# Patient Record
Sex: Female | Born: 1980 | Hispanic: Yes | Marital: Single | Smoking: Never smoker
Health system: Southern US, Community
[De-identification: ages and names within clinical notes are randomized; demographics above are authoritative.]

## PROBLEM LIST (undated history)

## (undated) DIAGNOSIS — D649 Anemia, unspecified: Secondary | ICD-10-CM

## (undated) DIAGNOSIS — N8502 Endometrial intraepithelial neoplasia [EIN]: Secondary | ICD-10-CM

## (undated) DIAGNOSIS — N939 Abnormal uterine and vaginal bleeding, unspecified: Secondary | ICD-10-CM

## (undated) DIAGNOSIS — E669 Obesity, unspecified: Secondary | ICD-10-CM

## (undated) DIAGNOSIS — C55 Malignant neoplasm of uterus, part unspecified: Secondary | ICD-10-CM

## (undated) HISTORY — DX: Anemia, unspecified: D64.9

## (undated) HISTORY — DX: Malignant neoplasm of uterus, part unspecified: C55

## (undated) HISTORY — DX: Obesity, unspecified: E66.9

## (undated) HISTORY — DX: Endometrial intraepithelial neoplasia (EIN): N85.02

## (undated) HISTORY — DX: Abnormal uterine and vaginal bleeding, unspecified: N93.9

## (undated) HISTORY — PX: DILATION AND CURETTAGE OF UTERUS: SHX78

---

## 2019-08-04 ENCOUNTER — Other Ambulatory Visit: Payer: Self-pay

## 2019-08-04 ENCOUNTER — Encounter (HOSPITAL_BASED_OUTPATIENT_CLINIC_OR_DEPARTMENT_OTHER): Payer: Self-pay | Admitting: Emergency Medicine

## 2019-08-04 ENCOUNTER — Emergency Department (HOSPITAL_BASED_OUTPATIENT_CLINIC_OR_DEPARTMENT_OTHER)
Admission: EM | Admit: 2019-08-04 | Discharge: 2019-08-04 | Disposition: A | Discharge status: Home / Self Care | Payer: Worker's Compensation | Attending: Emergency Medicine | Admitting: Emergency Medicine

## 2019-08-04 DIAGNOSIS — Y9289 Other specified places as the place of occurrence of the external cause: Secondary | ICD-10-CM | POA: Diagnosis not present

## 2019-08-04 DIAGNOSIS — Y990 Civilian activity done for income or pay: Secondary | ICD-10-CM | POA: Diagnosis not present

## 2019-08-04 DIAGNOSIS — F411 Generalized anxiety disorder: Secondary | ICD-10-CM

## 2019-08-04 DIAGNOSIS — F410 Panic disorder [episodic paroxysmal anxiety] without agoraphobia: Secondary | ICD-10-CM | POA: Diagnosis present

## 2019-08-04 DIAGNOSIS — Y9389 Activity, other specified: Secondary | ICD-10-CM | POA: Insufficient documentation

## 2019-08-04 MED ORDER — ONDANSETRON HCL 4 MG PO TABS
4.0000 mg | ORAL_TABLET | Freq: Once | ORAL | Status: DC
  Filled 2019-08-04: qty 1

## 2019-08-04 MED ORDER — ONDANSETRON 4 MG PO TBDP
4.0000 mg | ORAL_TABLET | Freq: Once | ORAL | Status: AC
  Administered 2019-08-04: 4 mg via ORAL
  Filled 2019-08-04: qty 1

## 2019-08-04 MED ORDER — ACETAMINOPHEN 325 MG PO TABS
650.0000 mg | ORAL_TABLET | Freq: Once | ORAL | Status: AC
  Administered 2019-08-04: 650 mg via ORAL
  Filled 2019-08-04: qty 2
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
## 2019-08-04 NOTE — ED Notes (Signed)
Pt ambulatory with steady gait, calm demeanor at bedside when being triaged.
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
## 2019-09-27 ENCOUNTER — Telehealth: Payer: Self-pay | Admitting: *Deleted

## 2019-09-27 NOTE — Telephone Encounter (Signed)
Called and left the patient a message to call the office back. Patient needs to be scheduled for a new patient appt  °

## 2019-10-04 ENCOUNTER — Encounter: Payer: Self-pay | Admitting: Gynecologic Oncology

## 2019-10-04 NOTE — Progress Notes (Signed)
GYNECOLOGIC ONCOLOGY NEW PATIENT CONSULTATION   Patient Name: Shirley Pearson  Patient Age: 39 y.o. Date of Service: 10/08/19 Referring Provider: Dr. 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  We reviewed the diagnosis of complex atypical hyperplasia (CAH) and the treatment options, including medical management (Mirena IUD or progesterone PO) or hysterectomy.    Given her desire to maintain fertility, she has elected medical management with the goal of disease clearance and prevention of invasive adenocarcinoma.     We discussed that endometrial cancer is detected in about 40% of final uterine pathology specimens from patients with CAH.  Thus, especially in the setting of her markedly thickened endometrial lining, I recommend that we proceed to the operating room for a D&C with Mirena IUD insertion.  We reviewed the role of progesterone therapy and the effect on preneoplastic lesions, believed to include induction of apoptosis in addition to tissue sloughing during withdrawal bleeding.  Activation of the progesterone receptors is believed to lead stromal decidualization and thinning of the lining.  We reviewed the 3 most studied options, to include levonorgesterol IUD (17mcg/d), oral medorxyprogesterone acetate 10mg  daily or cyclically [id number] days per month, or oral megesterol acetate 40-200mg  per day.    She understands that all options have few side effects, most common being infrequent edema, GI disturbances, and thromboembolic events), but that local progesterone through IUD may have a stronger effect on the endometrium with less systemic side effects.  Furthermore, studies demonstrate that 50-70% of women will have regression of their hyperplasia with  progesterone use.    With IUD use, we anticipate that the average duration to regression is 4.5 months, with all cases anticipated to have regression by 9 months.     She has elected to proceed with Mirena IUD insertion for treatment of her cancer.  We discussed that if her final pathology at the time of D&C comes back showing cancer, then we will have further discussion about fertility sparing versus proceeding with surgery.  If she elects to pursue fertility sparing, she understands that plan will be the same in terms of frequent endometrial sampling and that we will also obtain pelvic imaging to rule out Myometrial invasion.  The patient very much wants to maintain her fertility.  She has not been able to achieve pregnancy despite never really using birth control with her partner over a number of years.  We discussed that I think she has anovulatory bleeding and my hope is that with even a small amount of weight loss, this will impact both her hyperplasia but also her ovulation.  She understands that if she has multiple negative biopsies, we can consider removing the IUD for her to attempt pregnancy.  If this were to happen, I would want her to see a gynecologist for possible ovarian stimulation and more active management of pregnancy attempt.  For monitoring, she understands that there is no recommend standard of care.  We will base her surveillance on GOG 224 protocol.  This will include EMB at 3 months following initiation of treatment.  EMBs will be performed at 3 to 6 month intervals, until a minimum of 3 negative biopsy results are obtained, after which sampling frequeny may then be yearly or until new abnormal uterine bleeding develops.  If persistence of CAH is noted by  9 months of treatment, then we will discuss additional agents or readiness for surgery.    We also reviewed the importance of weight loss in overall health as well as reduction in cancer risk.  I have offered nutrition/exercise  counseling today.  The patient voices being motivated to continue with weight loss.  A copy of this note was sent to the patient's referring provider.  50 minutes of total time was spent for this patient encounter, including preparation, face-to-face counseling with the patient and coordination of care, and documentation of the encounter.    Jeral Pinch, MD  Division of Gynecologic Oncology  Department of Obstetrics and Gynecology  [facility]  ___________________________________________  Chief Complaint: Chief Complaint  Patient presents with  . Complex endometrial hyperplasia    Follow up    History of Present Illness:  Shirley Pearson is a 39 y.o. y.o. female who is seen in consultation at the request ofDr. McComb for an evaluation of CAH.  The patient has a long history of abnormal uterine bleeding and recently underwent endometrial biopsy.  Her pathology was initially read as complex hyperplasia but secondary review found at least focal atypia.  She is currently on oral birth control pills to help with abnormal uterine bleeding.  This is thought to be due secondary to anovulatory bleeding with significant anemia and a hemoglobin as low as 7.  Her hemoglobin had most recently improved to 8.4 after being started on oral contraceptive pills and iron supplementation.  Ultrasound showed a uterus measuring 15.8 x 12.6 x 14.2 cm with an endometrial lining measuring 9.1 cm.  Today, the patient reports having irregular menses for a significant amount of time, approximately 15 years.  She describes that her bleeding during that time was almost daily.  On her heaviest days, she would saturate 10-12 depends.  On her lighter days, she would not fully saturate 1.  She has low back pain with more significant bleeding and occasional pelvic pain and cramping.  Some years ago, she thinks approximately 10, she underwent 1 or 2 dilation and curettage is at the [facility] for heavy bleeding.  She remembers having increased bleeding afterwards.  Until she saw Dr. Renold Don, she denies any sort of hormonal modulation to help regulate her bleeding.  Patient was feeling very tired with no energy to do daily activities.  Since starting her Provera 10 mg daily, she has had significant decrease in her bleeding and notes improved energy.  She is also taking daily iron now.  She endorses having a decent appetite although for the last year endorses occasional nausea when she eats.  She notes some intermittent constipation especially since starting iron.  She is not using anything for bowel function regulation.  She denies any urinary symptoms.  PAST MEDICAL HISTORY:  Past Medical History:  Diagnosis Date  . Abnormal uterine bleeding   . Anemia   . Complex atypical endometrial hyperplasia   . Obesity (BMI 30-39.9)      PAST SURGICAL HISTORY:  Past Surgical History:  Procedure Laterality Date  . DILATION AND CURETTAGE OF UTERUS      OB/GYN HISTORY:  OB History  Gravida Para Term Preterm AB Living  0 0 0 0 0 0  SAB TAB Ectopic Multiple Live Births  0 0 0 0 0    No LMP recorded.  Last pap: 08/2019- atypical glandular cells History of abnormal pap smears: yes, see above; no history prior to recent pap  MEDICATIONS:  Outpatient Encounter Medications as of 10/08/2019  Medication Sig  . Ferrous Sulfate (IRON PO) Take by mouth.  . medroxyPROGESTERone (PROVERA) 10 MG tablet Take 10 mg by mouth daily.   No facility-administered encounter medications on file as of 10/08/2019.    ALLERGIES:  No Known Allergies   FAMILY HISTORY:  Family History  Problem Relation Age of Onset  . Diabetes Mother   . Colon cancer Neg Hx   . Breast cancer Neg Hx   . Ovarian cancer Neg Hx   . Uterine cancer Neg Hx      SOCIAL HISTORY:    Social Connections:   . Frequency of Communication with Friends and Family:   . Frequency of Social Gatherings with Friends and  Family:   . Attends Religious Services:   . Active Member of Clubs or Organizations:   . Attends Archivist Meetings:   Marland Kitchen Marital Status:     REVIEW OF SYSTEMS:  Pertinent positives as per HPI. Denies fevers, chills, fatigue, unexplained weight changes. Denies hearing loss, neck lumps or masses, mouth sores, ringing in ears or voice changes. Denies cough or wheezing.  Denies shortness of breath. Denies chest pain or palpitations. Denies leg swelling. Denies abdominal distention, pain, blood in stools, diarrhea, vomiting, or early satiety. Denies pain with intercourse, dysuria, frequency, hematuria or incontinence. Denies hot flashes or vaginal discharge.   Denies joint pain or muscle pain/cramps. Denies itching, rash, or wounds. Denies dizziness, headaches, numbness or seizures. Denies swollen lymph nodes or glands, denies easy bruising or bleeding. Denies anxiety, depression, confusion, or decreased concentration.  Physical Exam:  Vital Signs for this encounter:  Blood pressure 138/87, pulse 80, temperature 98.6 F (37 C), temperature source Temporal, resp. rate 17, height 5' (1.524 m), weight 160 lb 3.2 oz (72.7 kg), SpO2 100 %. Body mass index is 31.29 kg/m. General: Alert, oriented, no acute distress.  HEENT: Normocephalic, atraumatic. Sclera anicteric.  Chest: Clear to auscultation bilaterally. No wheezes, rhonchi, or rales. Cardiovascular: Regular rate and rhythm, no murmurs, rubs, or gallops.  Abdomen: Obese. Normoactive bowel sounds. Soft, nondistended, nontender to palpation. No hepatosplenomegaly appreciated.  Firmness noted in lower abdomen.  No palpable fluid wave.  Extremities: Grossly normal range of motion. Warm, well perfused. No edema bilaterally.  Skin: No rashes or lesions.  Lymphatics: No cervical, supraclavicular, or inguinal adenopathy.  GU:  Normal external female genitalia. No lesions. No discharge or bleeding.             Bladder/urethra:  No  lesions or masses, well supported bladder             Vagina: Well rugated, minimal blood in the vault.             Cervix: Normal appearing, no lesions.  Cervix visibly dilated approximately 1 cm with what appears to be tissue in the os.  With the patient's permission the cervix was cleansed with Betadine x3 and ring forceps were used to pull out several 1-2 cm pieces of tissue.  There was increased bleeding noted from the cervix after this was done.  Accommodation of pressure and Monsel's was used to achieve hemostasis.  Overall the patient tolerated the procedure well.               Uterus: Enlarged spanning within several centimeters of the patient's umbilicus and quite bulbous with a prominent lower uterine segment, no parametrial involvement or nodularity.  Adnexa: No masses appareciated.  LABORATORY AND RADIOLOGIC DATA:  Outside medical records were reviewed to synthesize the above history, along with the history and physical obtained during the visit.   No results found for: WBC, HGB, HCT, PLT, GLUCOSE, CHOL, TRIG, HDL, LDLDIRECT, LDLCALC, ALT, AST, NA, K, CL, CREATININE, BUN, CO2, TSH, PSA, INR, GLUF, HGBA1C, MICROALBUR  Endometrial biopsy on 4/29: Complex hyperplasia with focal atypia, likely involving a polyp.

## 2019-10-04 NOTE — H&P (View-Only) (Signed)
GYNECOLOGIC ONCOLOGY NEW PATIENT CONSULTATION   Patient Name: Shirley Pearson  Patient Age: 39 y.o. Date of Service: 10/08/19 Referring Provider: Dr. 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  We reviewed the diagnosis of complex atypical hyperplasia (CAH) and the treatment options, including medical management (Mirena IUD or progesterone PO) or hysterectomy.    Given her desire to maintain fertility, she has elected medical management with the goal of disease clearance and prevention of invasive adenocarcinoma.     We discussed that endometrial cancer is detected in about 40% of final uterine pathology specimens from patients with CAH.  Thus, especially in the setting of her markedly thickened endometrial lining, I recommend that we proceed to the operating room for a D&C with Mirena IUD insertion.  We reviewed the role of progesterone therapy and the effect on preneoplastic lesions, believed to include induction of apoptosis in addition to tissue sloughing during withdrawal bleeding.  Activation of the progesterone receptors is believed to lead stromal decidualization and thinning of the lining.  We reviewed the 3 most studied options, to include levonorgesterol IUD (53mcg/d), oral medorxyprogesterone acetate 10mg  daily or cyclically [id number] days per month, or oral megesterol acetate 40-200mg  per day.    She understands that all options have few side effects, most common being infrequent edema, GI disturbances, and thromboembolic events), but that local progesterone through IUD may have a stronger effect on the endometrium with less systemic side effects.  Furthermore, studies demonstrate that 50-70% of women will have regression of their hyperplasia with  progesterone use.    With IUD use, we anticipate that the average duration to regression is 4.5 months, with all cases anticipated to have regression by 9 months.     She has elected to proceed with Mirena IUD insertion for treatment of her cancer.  We discussed that if her final pathology at the time of D&C comes back showing cancer, then we will have further discussion about fertility sparing versus proceeding with surgery.  If she elects to pursue fertility sparing, she understands that plan will be the same in terms of frequent endometrial sampling and that we will also obtain pelvic imaging to rule out Myometrial invasion.  The patient very much wants to maintain her fertility.  She has not been able to achieve pregnancy despite never really using birth control with her partner over a number of years.  We discussed that I think she has anovulatory bleeding and my hope is that with even a small amount of weight loss, this will impact both her hyperplasia but also her ovulation.  She understands that if she has multiple negative biopsies, we can consider removing the IUD for her to attempt pregnancy.  If this were to happen, I would want her to see a gynecologist for possible ovarian stimulation and more active management of pregnancy attempt.  For monitoring, she understands that there is no recommend standard of care.  We will base her surveillance on GOG 224 protocol.  This will include EMB at 3 months following initiation of treatment.  EMBs will be performed at 3 to 6 month intervals, until a minimum of 3 negative biopsy results are obtained, after which sampling frequeny may then be yearly or until new abnormal uterine bleeding develops.  If persistence of CAH is noted by  9 months of treatment, then we will discuss additional agents or readiness for surgery.    We also reviewed the importance of weight loss in overall health as well as reduction in cancer risk.  I have offered nutrition/exercise  counseling today.  The patient voices being motivated to continue with weight loss.  A copy of this note was sent to the patient's referring provider.  50 minutes of total time was spent for this patient encounter, including preparation, face-to-face counseling with the patient and coordination of care, and documentation of the encounter.    Jeral Pinch, MD  Division of Gynecologic Oncology  Department of Obstetrics and Gynecology  [facility]  ___________________________________________  Chief Complaint: Chief Complaint  Patient presents with  . Complex endometrial hyperplasia    Follow up    History of Present Illness:  Shirley Pearson is a 39 y.o. y.o. female who is seen in consultation at the request ofDr. McComb for an evaluation of CAH.  The patient has a long history of abnormal uterine bleeding and recently underwent endometrial biopsy.  Her pathology was initially read as complex hyperplasia but secondary review found at least focal atypia.  She is currently on oral birth control pills to help with abnormal uterine bleeding.  This is thought to be due secondary to anovulatory bleeding with significant anemia and a hemoglobin as low as 7.  Her hemoglobin had most recently improved to 8.4 after being started on oral contraceptive pills and iron supplementation.  Ultrasound showed a uterus measuring 15.8 x 12.6 x 14.2 cm with an endometrial lining measuring 9.1 cm.  Today, the patient reports having irregular menses for a significant amount of time, approximately 15 years.  She describes that her bleeding during that time was almost daily.  On her heaviest days, she would saturate 10-12 depends.  On her lighter days, she would not fully saturate 1.  She has low back pain with more significant bleeding and occasional pelvic pain and cramping.  Some years ago, she thinks approximately 10, she underwent 1 or 2 dilation and curettage is at the [facility] for heavy bleeding.  She remembers having increased bleeding afterwards.  Until she saw Dr. Renold Don, she denies any sort of hormonal modulation to help regulate her bleeding.  Patient was feeling very tired with no energy to do daily activities.  Since starting her Provera 10 mg daily, she has had significant decrease in her bleeding and notes improved energy.  She is also taking daily iron now.  She endorses having a decent appetite although for the last year endorses occasional nausea when she eats.  She notes some intermittent constipation especially since starting iron.  She is not using anything for bowel function regulation.  She denies any urinary symptoms.  PAST MEDICAL HISTORY:  Past Medical History:  Diagnosis Date  . Abnormal uterine bleeding   . Anemia   . Complex atypical endometrial hyperplasia   . Obesity (BMI 30-39.9)      PAST SURGICAL HISTORY:  Past Surgical History:  Procedure Laterality Date  . DILATION AND CURETTAGE OF UTERUS      OB/GYN HISTORY:  OB History  Gravida Para Term Preterm AB Living  0 0 0 0 0 0  SAB TAB Ectopic Multiple Live Births  0 0 0 0 0    No LMP recorded.  Last pap: 08/2019- atypical glandular cells History of abnormal pap smears: yes, see above; no history prior to recent pap  MEDICATIONS:  Outpatient Encounter Medications as of 10/08/2019  Medication Sig  . Ferrous Sulfate (IRON PO) Take by mouth.  . medroxyPROGESTERone (PROVERA) 10 MG tablet Take 10 mg by mouth daily.   No facility-administered encounter medications on file as of 10/08/2019.    ALLERGIES:  No Known Allergies   FAMILY HISTORY:  Family History  Problem Relation Age of Onset  . Diabetes Mother   . Colon cancer Neg Hx   . Breast cancer Neg Hx   . Ovarian cancer Neg Hx   . Uterine cancer Neg Hx      SOCIAL HISTORY:    Social Connections:   . Frequency of Communication with Friends and Family:   . Frequency of Social Gatherings with Friends and  Family:   . Attends Religious Services:   . Active Member of Clubs or Organizations:   . Attends Archivist Meetings:   Marland Kitchen Marital Status:     REVIEW OF SYSTEMS:  Pertinent positives as per HPI. Denies fevers, chills, fatigue, unexplained weight changes. Denies hearing loss, neck lumps or masses, mouth sores, ringing in ears or voice changes. Denies cough or wheezing.  Denies shortness of breath. Denies chest pain or palpitations. Denies leg swelling. Denies abdominal distention, pain, blood in stools, diarrhea, vomiting, or early satiety. Denies pain with intercourse, dysuria, frequency, hematuria or incontinence. Denies hot flashes or vaginal discharge.   Denies joint pain or muscle pain/cramps. Denies itching, rash, or wounds. Denies dizziness, headaches, numbness or seizures. Denies swollen lymph nodes or glands, denies easy bruising or bleeding. Denies anxiety, depression, confusion, or decreased concentration.  Physical Exam:  Vital Signs for this encounter:  Blood pressure 138/87, pulse 80, temperature 98.6 F (37 C), temperature source Temporal, resp. rate 17, height 5' (1.524 m), weight 160 lb 3.2 oz (72.7 kg), SpO2 100 %. Body mass index is 31.29 kg/m. General: Alert, oriented, no acute distress.  HEENT: Normocephalic, atraumatic. Sclera anicteric.  Chest: Clear to auscultation bilaterally. No wheezes, rhonchi, or rales. Cardiovascular: Regular rate and rhythm, no murmurs, rubs, or gallops.  Abdomen: Obese. Normoactive bowel sounds. Soft, nondistended, nontender to palpation. No hepatosplenomegaly appreciated.  Firmness noted in lower abdomen.  No palpable fluid wave.  Extremities: Grossly normal range of motion. Warm, well perfused. No edema bilaterally.  Skin: No rashes or lesions.  Lymphatics: No cervical, supraclavicular, or inguinal adenopathy.  GU:  Normal external female genitalia. No lesions. No discharge or bleeding.             Bladder/urethra:  No  lesions or masses, well supported bladder             Vagina: Well rugated, minimal blood in the vault.             Cervix: Normal appearing, no lesions.  Cervix visibly dilated approximately 1 cm with what appears to be tissue in the os.  With the patient's permission the cervix was cleansed with Betadine x3 and ring forceps were used to pull out several 1-2 cm pieces of tissue.  There was increased bleeding noted from the cervix after this was done.  Accommodation of pressure and Monsel's was used to achieve hemostasis.  Overall the patient tolerated the procedure well.               Uterus: Enlarged spanning within several centimeters of the patient's umbilicus and quite bulbous with a prominent lower uterine segment, no parametrial involvement or nodularity.  Adnexa: No masses appareciated.  LABORATORY AND RADIOLOGIC DATA:  Outside medical records were reviewed to synthesize the above history, along with the history and physical obtained during the visit.   No results found for: WBC, HGB, HCT, PLT, GLUCOSE, CHOL, TRIG, HDL, LDLDIRECT, LDLCALC, ALT, AST, NA, K, CL, CREATININE, BUN, CO2, TSH, PSA, INR, GLUF, HGBA1C, MICROALBUR  Endometrial biopsy on 4/29: Complex hyperplasia with focal atypia, likely involving a polyp.

## 2019-10-08 ENCOUNTER — Other Ambulatory Visit: Payer: Self-pay | Admitting: Gynecologic Oncology

## 2019-10-08 ENCOUNTER — Other Ambulatory Visit: Payer: Self-pay

## 2019-10-08 ENCOUNTER — Inpatient Hospital Stay: Payer: Self-pay | Attending: Gynecologic Oncology | Admitting: Gynecologic Oncology

## 2019-10-08 ENCOUNTER — Encounter: Payer: Self-pay | Admitting: Gynecologic Oncology

## 2019-10-08 VITALS — BP 138/87 | HR 80 | Temp 98.6°F | Resp 17 | Ht 60.0 in | Wt 160.2 lb

## 2019-10-08 DIAGNOSIS — N8501 Benign endometrial hyperplasia: Secondary | ICD-10-CM

## 2019-10-08 DIAGNOSIS — K5900 Constipation, unspecified: Secondary | ICD-10-CM | POA: Insufficient documentation

## 2019-10-08 DIAGNOSIS — D500 Iron deficiency anemia secondary to blood loss (chronic): Secondary | ICD-10-CM

## 2019-10-08 MED ORDER — SENNA 8.6 MG PO TABS: 1.0000 | ORAL_TABLET | Freq: Every day | ORAL | 3 refills | Status: DC | PRN

## 2019-10-08 NOTE — Addendum Note (Signed)
Addended by: Joylene John D on: 10/08/2019 02:47 PM   Modules accepted: Orders

## 2019-10-08 NOTE — Progress Notes (Signed)
[place] intraop ordered for [facility] on June 7

## 2019-10-08 NOTE — Patient Instructions (Signed)
Preparing for your Surgery  Plan for surgery on October 22, 2019 with Dr. Jeral Pinch at [facility].  You will be scheduled for a dilation and curettage with Mirena intrauterine device placement, possible hysteroscopy, possible ultrasound.   Pre-operative Testing -You will receive a phone call from presurgical testing at [facility] to arrange for a pre-operative appointment over the phone, lab appointment, and COVID test. The COVID test normally happens 3 days prior to the surgery and they ask that you self quarantine after the test up until surgery to decrease chance of exposure.  -Bring your insurance card, copy of an advanced directive if applicable, medication list  -At that visit, you will be asked to sign a consent for a possible blood transfusion in case a transfusion becomes necessary during surgery.  The need for a blood transfusion is rare but having consent is a necessary part of your care.     -You should not be taking blood thinners or aspirin at least ten days prior to surgery unless instructed by your surgeon.  -Do not take supplements such as fish oil (omega 3), red yeast rice, turmeric before your surgery.   Day Before Surgery at [place] will be advised you can have clear liquids after midnight and up until 3 hours before your surgery.    Your role in recovery Your role is to become active as soon as directed by your doctor, while still giving yourself time to heal.  Rest when you feel tired. You will be asked to do the following in order to speed your recovery:  - Cough and breathe deeply. This helps to clear and expand your lungs and can prevent pneumonia after surgery.  - [place]. Do mild physical activity. Walking or moving your legs help your circulation and body functions return to normal. Do not try to get up or walk alone the first time after surgery.   -If you develop swelling on one leg or the other,  pain in the back of your leg, redness/warmth in one of your legs, please call the office or go to the Emergency Room to have a doppler to rule out a blood clot. For shortness of breath, chest pain-seek care in the Emergency Room as soon as possible. - Actively manage your pain. Managing your pain lets you move in comfort. We will ask you to rate your pain on a scale of zero to 10. It is your responsibility to tell your doctor or nurse where and how much you hurt so your pain can be treated.  Special Considerations -FMLA forms can be faxed to [phone] and please allow 5-7 business days for completion.  Bowel Regimen -You have been prescribed Sennakot-S to take nightly to prevent constipation.  It is important to prevent constipation and drink adequate amounts of liquids. You can stop taking this medication when you are not taking pain medication and you are back on your normal bowel routine.   Blood Transfusion Information (For the consent to be signed before surgery)  We will be checking your blood type before surgery so in case of emergencies, we will know what type of blood you would need.                                            WHAT IS A BLOOD  TRANSFUSION?  A transfusion is the replacement of blood or some of its parts. Blood is made up of multiple cells which provide different functions.  Red blood cells carry oxygen and are used for blood loss replacement.  White blood cells fight against infection.  Platelets control bleeding.  Plasma helps clot blood.  Other blood products are available for specialized needs, such as hemophilia or other clotting disorders. BEFORE THE TRANSFUSION  Who gives blood for transfusions?   You may be able to donate blood to be used at a later date on yourself (autologous donation).  Relatives can be asked to donate blood. This is generally not any safer than if you have received blood from a stranger. The same precautions are taken to ensure  safety when a relative's blood is donated.  Healthy volunteers who are fully evaluated to make sure their blood is safe. This is blood bank blood. Transfusion therapy is the safest it has ever been in the practice of medicine. Before blood is taken from a donor, a complete history is taken to make sure that person has no history of diseases nor engages in risky social behavior (examples are intravenous drug use or sexual activity with multiple partners). The donor's travel history is screened to minimize risk of transmitting infections, such as malaria. The donated blood is tested for signs of infectious diseases, such as HIV and hepatitis. The blood is then tested to be sure it is compatible with you in order to minimize the chance of a transfusion reaction. If you or a relative donates blood, this is often done in anticipation of surgery and is not appropriate for emergency situations. It takes many days to process the donated blood. RISKS AND COMPLICATIONS Although transfusion therapy is very safe and saves many lives, the main dangers of transfusion include:   Getting an infectious disease.  Developing a transfusion reaction. This is an allergic reaction to something in the blood you were given. Every precaution is taken to prevent this. The decision to have a blood transfusion has been considered carefully by your caregiver before blood is given. Blood is not given unless the benefits outweigh the risks.  AFTER SURGERY INSTRUCTIONS  Return to work: 2-4 days if applicable  Activity: 1. Be up and out of the bed during the day.  Take a nap if needed.  You may walk up steps but be careful and use the hand rail.  Stair climbing will tire you more than you think, you may need to stop part way and rest.   3. No driving for 24 hours after the procedure.  Do not drive until your reaction time has returned.   4. You can shower as soon as the next day after surgery. Shower daily.  Use soap and water  on your incision and pat dry; don't rub.  No tub baths or submerging your body in water until cleared by your surgeon.   5. No sexual activity and nothing in the vagina for 2 weeks.   6. You may experience vaginal spotting after surgery or around the 6-8 week mark from surgery when the stitches at the top of the vagina begin to dissolve.  The spotting is normal but if you experience heavy bleeding, call our office.  9. Take Tylenol or ibuprofen first for pain.  Monitor your Tylenol intake to a max of 4,000 mg in a 24 hour period.  Diet: 1. Low sodium Heart Healthy Diet is recommended.  2. It is safe to  use a laxative, such as Miralax or Colace, if you have difficulty moving your bowels. You can take Sennakot at bedtime every evening to keep bowel movements regular and to prevent constipation.    Wound Care: 1. Keep clean and dry.  Shower daily.  Reasons to call the Doctor:  Fever - Oral temperature greater than 100.4 degrees Fahrenheit  Foul-smelling vaginal discharge  Difficulty urinating  Nausea and vomiting  Increased pain at the site of the incision that is unrelieved with pain medicine.  Difficulty breathing with or without chest pain  New calf pain especially if only on one side  Sudden, continuing increased vaginal bleeding with or without clots.   Contacts: For questions or concerns you should contact:  Dr. Jeral Pinch at [phone]  Joylene John, NP at [phone]  After Hours: call [phone] and have the GYN Oncologist paged/contacted   Intrauterine Device Insertion An intrauterine device (IUD) is a medical device that gets inserted into the uterus to prevent pregnancy. It is a small, T-shaped device that has one or two nylon strings hanging down from it. The strings hang out of the lower part of the uterus (cervix) to allow for future IUD removal. There are two types of IUDs available:  Copper IUD. This type of IUD has copper wire wrapped around  it. Copper makes the uterus and fallopian tubes produce a fluid that kills sperm. A copper IUD may last up to 10 years.  Hormone IUD. This type of IUD is made of plastic and contains the hormone progestin (synthetic progesterone). The hormone thickens mucus in the cervix and prevents sperm from entering the uterus. It also thins the uterine lining to prevent implantation of a fertilized egg. The hormone can weaken or kill the sperm that get into the uterus. A hormone IUD may last 3-5 years. Tell a health care provider about:  Any allergies you have.  All medicines you are taking, including vitamins, herbs, eye drops, creams, and over-the-counter medicines.  Any problems you or family members have had with anesthetic medicines.  Any blood disorders you have.  Any surgeries you have had.  Any medical conditions you have, including any STIs (sexually transmitted infections) you may have.  Whether you are pregnant or may be pregnant. What are the risks? Generally, this is a safe procedure. However, problems may occur, including:  Infection.  Bleeding.  Allergic reactions to medicines.  Accidental puncture (perforation) of the uterus, or damage to other structures or organs.  Accidental placement of the IUD either in the muscle layer of the uterus (myometrium) or outside the uterus.  The IUD falling out of the uterus (expulsion). This is more common among women who have recently had a child.  Pregnancy that happens in the fallopian tube (ectopic pregnancy).  Infection of the uterus and fallopian tubes (pelvic inflammatory disease). What happens before the procedure?  Schedule the IUD insertion for when you will have your menstrual period or right after, to make sure you are not pregnant. Placement of the IUD is better tolerated shortly after a menstrual cycle.  Follow instructions from your health care provider about eating or drinking restrictions.  Ask your health care provider  about changing or stopping your regular medicines. This is especially important if you are taking diabetes medicines or blood thinners.  You may get a pain reliever to take before the procedure.  You may have tests for:  Pregnancy. A pregnancy test involves having a urine sample taken.  STIs. Placing an IUD  in someone who has an STI can make the infection worse.  Cervical cancer. You may have a Pap test to check for this type of cancer. This means collecting cells from your cervix to be examined under a microscope.  You may have a physical exam to determine the size and position of your uterus. The procedure may vary among health care providers and hospitals. What happens during the procedure?  A tool (speculum) will be placed in your vagina and widened so that your health care provider can see your cervix.  Medicine may be applied to your cervix to help lower your risk of infection (antiseptic medicine).  You may be given an anesthetic medicine to numb each side of your cervix (intracervical block or paracervical block). This medicine is usually given by an injection into the cervix.  A tool (uterine sound) will be inserted into your uterus to determine the length of your uterus and the direction that your uterus may be tilted.  A slim instrument or tube (IUD inserter) that holds the IUD will be inserted into your vagina, through your cervical canal, and into your uterus.  The IUD will be placed in the uterus, and the IUD inserter will be removed.  The strings that are attached to the IUD will be trimmed so that they lie just below the cervix. The procedure may vary among health care providers and hospitals. What happens after the procedure?  You may have bleeding after the procedure. This is normal. It varies from light bleeding (spotting) for a few days to menstrual-like bleeding.  You may have cramping and pain.  You may feel dizzy or light-headed.  You may have lower back  pain. Summary  An intrauterine device (IUD) is a small, T-shaped device that has one or two nylon strings hanging down from it.  Two types of IUDs are available. You may have a copper IUD or a hormone IUD.  Schedule the IUD insertion for when you will have your menstrual period or right after, to make sure you are not pregnant. Placement of the IUD is better tolerated shortly after a menstrual cycle.  You may have bleeding after the procedure. This is normal. It varies from light spotting for a few days to menstrual-like bleeding. This information is not intended to replace advice given to you by your health care provider. Make sure you discuss any questions you have with your health care provider. Document Released: 12/30/2010 Document Revised: 03/24/2016 Document Reviewed: 03/24/2016 Elsevier Interactive Patient Education  2017 [employer].  Levonorgestrel intrauterine device (IUD) What is this medicine? LEVONORGESTREL IUD (LEE voe nor jes trel) is a contraceptive (birth control) device. The device is placed inside the uterus by a healthcare professional. It is used to treat hyperplasia. This device can also be used to treat heavy bleeding that occurs during your period. This medicine may be used for other purposes; ask your health care provider or pharmacist if you have questions. COMMON BRAND NAME(S): Minette Headland What should I tell my health care provider before I take this medicine? They need to know if you have any of these conditions: -abnormal Pap smear -cancer of the breast, uterus, or cervix -diabetes -endometritis -genital or pelvic infection now or in the past -have more than one sexual partner or your partner has more than one partner -heart disease -history of an ectopic or tubal pregnancy -immune system problems -IUD in place -liver disease or tumor -problems with blood clots or take blood-thinners -seizures -  use intravenous drugs -uterus of  unusual shape -vaginal bleeding that has not been explained -an unusual or allergic reaction to levonorgestrel, other hormones, silicone, or polyethylene, medicines, foods, dyes, or preservatives -pregnant or trying to get pregnant -breast-feeding How should I use this medicine? This device is placed inside the uterus by a health care professional. Talk to your pediatrician regarding the use of this medicine in children. Special care may be needed. Overdosage: If you think you have taken too much of this medicine contact a poison control center or emergency room at once. NOTE: This medicine is only for you. Do not share this medicine with others. What if I miss a dose? This does not apply. Depending on the brand of device you have inserted, the device will need to be replaced every 3 to 5 years if you wish to continue using this type of birth control. What may interact with this medicine? Do not take this medicine with any of the following medications: -amprenavir -bosentan -fosamprenavir This medicine may also interact with the following medications: -aprepitant -armodafinil -barbiturate medicines for inducing sleep or treating seizures -bexarotene -boceprevir -griseofulvin -medicines to treat seizures like carbamazepine, ethotoin, felbamate, oxcarbazepine, phenytoin, topiramate -modafinil -pioglitazone -rifabutin -rifampin -rifapentine -some medicines to treat HIV infection like atazanavir, efavirenz, indinavir, lopinavir, nelfinavir, tipranavir, ritonavir -St. John's wort -warfarin This list may not describe all possible interactions. Give your health care provider a list of all the medicines, herbs, non-prescription drugs, or dietary supplements you use. Also tell them if you smoke, drink alcohol, or use illegal drugs. Some items may interact with your medicine. What should I watch for while using this medicine? Visit your doctor or health care professional for regular check  ups. See your doctor if you or your partner has sexual contact with others, becomes HIV positive, or gets a sexual transmitted disease. This product does not protect you against HIV infection (AIDS) or other sexually transmitted diseases. You can check the placement of the IUD yourself by reaching up to the top of your vagina with clean fingers to feel the threads. Do not pull on the threads. It is a good habit to check placement after each menstrual period. Call your doctor right away if you feel more of the IUD than just the threads or if you cannot feel the threads at all. The IUD may come out by itself. You may become pregnant if the device comes out. If you notice that the IUD has come out use a backup birth control method like condoms and call your health care provider. Using tampons will not change the position of the IUD and are okay to use during your period. This IUD can be safely scanned with magnetic resonance imaging (MRI) only under specific conditions. Before you have an MRI, tell your healthcare provider that you have an IUD in place, and which type of IUD you have in place. What side effects may I notice from receiving this medicine? Side effects that you should report to your doctor or health care professional as soon as possible: -allergic reactions like skin rash, itching or hives, swelling of the face, lips, or tongue -fever, flu-like symptoms -genital sores -high blood pressure -no menstrual period for 6 weeks during use -pain, swelling, warmth in the leg -pelvic pain or tenderness -severe or sudden headache -signs of pregnancy -stomach cramping -sudden shortness of breath -trouble with balance, talking, or walking -unusual vaginal bleeding, discharge -yellowing of the eyes or skin Side effects that usually do not  require medical attention (report to your doctor or health care professional if they continue or are bothersome): -acne -breast pain -change in sex drive or  performance -changes in weight -cramping, dizziness, or faintness while the device is being inserted -headache -irregular menstrual bleeding within first 3 to 6 months of use -nausea This list may not describe all possible side effects. Call your doctor for medical advice about side effects. You may report side effects to FDA at 1-800-FDA-1088. Where should I keep my medicine? This does not apply. NOTE: This sheet is a summary. It may not cover all possible information. If you have questions about this medicine, talk to your doctor, pharmacist, or health care provider.  2018 Elsevier/Gold Standard (2016-02-13 14:14:56)   Dilation and Curettage or Vacuum Curettage   Dilation and curettage (D&C) and vacuum curettage are minor procedures. A D&C involves stretching (dilation) the cervix and scraping (curettage) the inside lining of the uterus (endometrium). During a D&C, tissue is gently scraped from the endometrium, starting from the top portion of the uterus down to the lowest part of the uterus (cervix). During a vacuum curettage, the lining and tissue in the uterus are removed with the use of gentle suction. Curettage may be performed to either diagnose or treat a problem. As a diagnostic procedure, curettage is performed to examine tissues from the uterus. A diagnostic curettage may be done if you have:  Irregular bleeding in the uterus.  Bleeding with the development of clots.  Spotting between menstrual periods.  Prolonged menstrual periods or other abnormal bleeding.  Bleeding after menopause.  No menstrual period (amenorrhea).  A change in size and shape of the uterus.  Abnormal endometrial cells discovered during a Pap test. As a treatment procedure, curettage may be performed for the following reasons:  Removal of an IUD (intrauterine device).  Removal of retained placenta after giving birth.  Abortion.  Miscarriage.  Removal of endometrial polyps.  Removal of  uncommon types of noncancerous lumps (fibroids). Tell a health care provider about:  Any allergies you have, including allergies to prescribed medicine or latex.  All medicines you are taking, including vitamins, herbs, eye drops, creams, and over-the-counter medicines. This is especially important if you take any blood-thinning medicine. Bring a list of all of your medicines to your appointment.  Any problems you or family members have had with anesthetic medicines.  Any blood disorders you have.  Any surgeries you have had.  Your medical history and any medical conditions you have.  Whether you are pregnant or may be pregnant.  Recent vaginal infections you have had.  Recent menstrual periods, bleeding problems you have had, and what form of birth control (contraception) you use. What are the risks? Generally, this is a safe procedure. However, problems may occur, including:  Infection.  Heavy vaginal bleeding.  Allergic reactions to medicines.  Damage to the cervix or other structures or organs.  Development of scar tissue (adhesions) inside the uterus, which can cause abnormal amounts of menstrual bleeding. This may make it harder to get pregnant in the future.  A hole (perforation) or puncture in the uterine wall. This is rare. What happens before the procedure?  Medicines  Ask your health care provider about: ? Changing or stopping your regular medicines. This is especially important if you are taking diabetes medicines or blood thinners. ? Taking medicines such as aspirin and ibuprofen. These medicines can thin your blood. Do not take these medicines before your procedure if your health care  provider instructs you not to.  You may be given antibiotic medicine to help prevent infection. General instructions  For 24 hours before your procedure, do not: ? Douche. ? Use tampons. ? Use medicines, creams, or suppositories in the vagina. ? Have sexual intercourse.   You may be given a pregnancy test on the day of the procedure.  Plan to have someone take you home from the hospital or clinic.  You may have a blood or urine sample taken.  If you will be going home right after the procedure, plan to have someone with you for 24 hours. What happens during the procedure?  To reduce your risk of infection: ? Your health care team will wash or sanitize their hands. ? Your skin will be washed with soap.  An IV tube will be inserted into one of your veins.  You will be given one of the following: ? A medicine that numbs the area in and around the cervix (local anesthetic). ? A medicine to make you fall asleep (general anesthetic).  You will lie down on your back, with your feet in foot rests (stirrups).  The size and position of your uterus will be checked.  A lubricated instrument (speculum or Sims retractor) will be inserted into the back side of your vagina. The speculum will be used to hold apart the walls of your vagina so your health care provider can see your cervix.  A tool (tenaculum) will be attached to the lip of the cervix to stabilize it.  Your cervix will be softened and dilated. This may be done by: ? Taking a medicine. ? Having tapered dilators or thin rods (laminaria) or gradual widening instruments (tapered dilators) inserted into your cervix.  A small, sharp, curved instrument (curette) will be used to scrape a small amount of tissue or cells from the endometrium or cervical canal. In some cases, gentle suction is applied with the curette. The curette will then be removed. The cells will be taken to a lab for testing. The procedure may vary among health care providers and hospitals. What happens after the procedure?  You may have mild cramping, backache, pain, and light bleeding or spotting. You may pass small blood clots from your vagina.  You may have to wear compression stockings. These stockings help to prevent blood clots and  reduce swelling in your legs.  Your blood pressure, heart rate, breathing rate, and blood oxygen level will be monitored until the medicines you were given have worn off. Summary  Dilation and curettage (D&C) involves stretching (dilation) the cervix and scraping (curettage) the inside lining of the uterus (endometrium).  After the procedure, you may have mild cramping, backache, pain, and light bleeding or spotting. You may pass small blood clots from your vagina.  Plan to have someone take you home from the hospital or clinic. This information is not intended to replace advice given to you by your health care provider. Make sure you discuss any questions you have with your health care provider. Document Revised: 04/15/2017 Document Reviewed: 01/18/2016 Elsevier Patient Education  2020 [employer].

## 2019-10-09 ENCOUNTER — Telehealth: Payer: Self-pay | Admitting: Gynecologic Oncology

## 2019-10-09 DIAGNOSIS — C541 Malignant neoplasm of endometrium: Secondary | ICD-10-CM

## 2019-10-09 NOTE — Telephone Encounter (Signed)
Called patient and reviewed pathology from biopsy yesterday - shows grade 1 endometrioid adenocarcinoma. Reviewed plan to proceed as discussed with D&C, Mirena IUD placement and MRI given cancer diagnosis.  Jeral Pinch MD Gynecologic Oncology

## 2019-10-10 ENCOUNTER — Encounter: Payer: Self-pay | Admitting: Gynecologic Oncology

## 2019-10-10 DIAGNOSIS — C541 Malignant neoplasm of endometrium: Secondary | ICD-10-CM | POA: Insufficient documentation

## 2019-10-10 LAB — SURGICAL PATHOLOGY

## 2019-10-16 ENCOUNTER — Other Ambulatory Visit: Payer: Self-pay

## 2019-10-16 ENCOUNTER — Encounter (HOSPITAL_BASED_OUTPATIENT_CLINIC_OR_DEPARTMENT_OTHER): Payer: Self-pay | Admitting: Gynecologic Oncology

## 2019-10-16 NOTE — Progress Notes (Signed)
Spoke w/ via phone for pre-op interview---patient Lab needs dos----    Urine preg          Lab results------has lab appt 10-18-2019 at 300 pm for cbc, bmet, ua, type and screen COVID test ------10-19-2019 at 925 am Arrive at -------915 am 10-22-2019 No food after midnight , clear liquids from midnight until 815 am then npo Medications to take morning of surgery ------provera Diabetic medication -----n/a Patient Special Instructions -----none Pre-Op special Istructions -----none Patient verbalized understanding of instructions that were given at this phone interview. Patient denies shortness of breath, chest pain, fever, cough a this phone interview.

## 2019-10-18 ENCOUNTER — Encounter (HOSPITAL_COMMUNITY)
Admission: RE | Admit: 2019-10-18 | Discharge: 2019-10-18 | Disposition: A | Discharge status: Home / Self Care | Payer: Self-pay | Source: Ambulatory Visit | Attending: Gynecologic Oncology | Admitting: Gynecologic Oncology

## 2019-10-18 ENCOUNTER — Other Ambulatory Visit: Payer: Self-pay

## 2019-10-18 DIAGNOSIS — Z01812 Encounter for preprocedural laboratory examination: Secondary | ICD-10-CM | POA: Insufficient documentation

## 2019-10-18 LAB — CBC
HCT: 38.1 % (ref 36.0–46.0)
Hemoglobin: 11.6 g/dL — ABNORMAL LOW (ref 12.0–15.0)
MCH: 24.8 pg — ABNORMAL LOW (ref 26.0–34.0)
MCHC: 30.4 g/dL (ref 30.0–36.0)
MCV: 81.6 fL (ref 80.0–100.0)
Platelets: 472 10*3/uL — ABNORMAL HIGH (ref 150–400)
RBC: 4.67 MIL/uL (ref 3.87–5.11)
RDW: 22.1 % — ABNORMAL HIGH (ref 11.5–15.5)
WBC: 7.8 10*3/uL (ref 4.0–10.5)
nRBC: 0 % (ref 0.0–0.2)

## 2019-10-18 LAB — BASIC METABOLIC PANEL
Anion gap: 7 (ref 5–15)
BUN: 10 mg/dL (ref 6–20)
CO2: 25 mmol/L (ref 22–32)
Calcium: 8.9 mg/dL (ref 8.9–10.3)
Chloride: 107 mmol/L (ref 98–111)
Creatinine, Ser: 0.48 mg/dL (ref 0.44–1.00)
GFR calc Af Amer: >60 mL/min (ref >60)
GFR calc non Af Amer: >60 mL/min (ref >60)
Glucose, Bld: 89 mg/dL (ref 70–99)
Potassium: 4.3 mmol/L (ref 3.5–5.1)
Sodium: 139 mmol/L (ref 135–145)

## 2019-10-18 LAB — ABO/RH: ABO/RH(D): B POS

## 2019-10-19 ENCOUNTER — Inpatient Hospital Stay (HOSPITAL_COMMUNITY): Admission: RE | Admit: 2019-10-19 | Payer: Self-pay | Source: Ambulatory Visit

## 2019-10-19 ENCOUNTER — Other Ambulatory Visit (HOSPITAL_COMMUNITY)
Admission: RE | Admit: 2019-10-19 | Discharge: 2019-10-19 | Disposition: A | Discharge status: Home / Self Care | Payer: HRSA Program | Source: Ambulatory Visit | Attending: Gynecologic Oncology | Admitting: Gynecologic Oncology

## 2019-10-19 ENCOUNTER — Encounter (HOSPITAL_COMMUNITY): Admission: RE | Admit: 2019-10-19 | Payer: Self-pay | Source: Ambulatory Visit

## 2019-10-19 ENCOUNTER — Telehealth: Payer: Self-pay

## 2019-10-19 DIAGNOSIS — Z01812 Encounter for preprocedural laboratory examination: Secondary | ICD-10-CM | POA: Insufficient documentation

## 2019-10-19 DIAGNOSIS — Z20822 Contact with and (suspected) exposure to covid-19: Secondary | ICD-10-CM | POA: Insufficient documentation

## 2019-10-19 LAB — URINALYSIS, ROUTINE W REFLEX MICROSCOPIC
Bilirubin Urine: NEGATIVE
Glucose, UA: NEGATIVE mg/dL
Ketones, ur: NEGATIVE mg/dL
Leukocytes,Ua: NEGATIVE
Nitrite: NEGATIVE
Protein, ur: 30 mg/dL — AB
Specific Gravity, Urine: 1.024 (ref 1.005–1.030)
pH: 5 (ref 5.0–8.0)

## 2019-10-19 NOTE — Telephone Encounter (Signed)
Told Ms Shirley Pearson that her surgery was moved up to [place] on Monday 10-22-19. She needs to arrive at the surgery center at [place]. She can only have clear liquids after MN until 0500 Monday. She does not need to take the provera on Monday morning per Melissa Cross,NP. Pt had covid test today as well as urinalysis. Discussed scheduling  the MRI of the Pelvis. She prefers mornings.

## 2019-10-19 NOTE — Progress Notes (Signed)
I spoke with daughter and made her aware to arrive at 6:45 AM, verbalized understanding.

## 2019-10-19 NOTE — Telephone Encounter (Signed)
LM for Shirley Pearson to call back to the office to discuss MRI of the Pelvis Appointment for 10-31-19 at [facility].@ 0800. She needs to arrive at [facility] at 0730.

## 2019-10-19 NOTE — Telephone Encounter (Signed)
Told Ms Ault  The appointment for the MRI as noted below.

## 2019-10-20 LAB — SARS CORONAVIRUS 2 (TAT 6-24 HRS): SARS Coronavirus 2: NEGATIVE

## 2019-10-22 ENCOUNTER — Encounter (HOSPITAL_BASED_OUTPATIENT_CLINIC_OR_DEPARTMENT_OTHER): Payer: Self-pay | Admitting: Gynecologic Oncology

## 2019-10-22 ENCOUNTER — Encounter (HOSPITAL_BASED_OUTPATIENT_CLINIC_OR_DEPARTMENT_OTHER)
Admission: RE | Disposition: A | Discharge status: Home / Self Care | Payer: Self-pay | Source: Ambulatory Visit | Attending: Gynecologic Oncology

## 2019-10-22 ENCOUNTER — Ambulatory Visit (HOSPITAL_COMMUNITY)
Admission: RE | Admit: 2019-10-22 | Discharge: 2019-10-22 | Disposition: A | Discharge status: Home / Self Care | Payer: Self-pay | Source: Ambulatory Visit | Attending: Gynecologic Oncology | Admitting: Gynecologic Oncology

## 2019-10-22 ENCOUNTER — Ambulatory Visit (HOSPITAL_BASED_OUTPATIENT_CLINIC_OR_DEPARTMENT_OTHER): Payer: Self-pay | Admitting: Certified Registered"

## 2019-10-22 ENCOUNTER — Ambulatory Visit (HOSPITAL_BASED_OUTPATIENT_CLINIC_OR_DEPARTMENT_OTHER)
Admission: RE | Admit: 2019-10-22 | Discharge: 2019-10-22 | Disposition: A | Discharge status: Home / Self Care | Payer: Self-pay | Source: Ambulatory Visit | Attending: Gynecologic Oncology | Admitting: Gynecologic Oncology

## 2019-10-22 ENCOUNTER — Other Ambulatory Visit: Payer: Self-pay

## 2019-10-22 DIAGNOSIS — E669 Obesity, unspecified: Secondary | ICD-10-CM | POA: Insufficient documentation

## 2019-10-22 DIAGNOSIS — C541 Malignant neoplasm of endometrium: Secondary | ICD-10-CM | POA: Insufficient documentation

## 2019-10-22 DIAGNOSIS — N939 Abnormal uterine and vaginal bleeding, unspecified: Secondary | ICD-10-CM | POA: Insufficient documentation

## 2019-10-22 DIAGNOSIS — N8501 Benign endometrial hyperplasia: Secondary | ICD-10-CM

## 2019-10-22 DIAGNOSIS — Z6831 Body mass index (BMI) 31.0-31.9, adult: Secondary | ICD-10-CM | POA: Insufficient documentation

## 2019-10-22 HISTORY — PX: DILATION AND CURETTAGE OF UTERUS: SHX78

## 2019-10-22 HISTORY — PX: OPERATIVE ULTRASOUND: SHX5996

## 2019-10-22 HISTORY — PX: INTRAUTERINE DEVICE (IUD) INSERTION: SHX5877

## 2019-10-22 LAB — TYPE AND SCREEN
ABO/RH(D): B POS
Antibody Screen: NEGATIVE

## 2019-10-22 LAB — POCT PREGNANCY, URINE: Preg Test, Ur: NEGATIVE

## 2019-10-22 SURGERY — DILATION AND CURETTAGE
Anesthesia: General | Site: Vagina

## 2019-10-22 MED ORDER — ONDANSETRON HCL 4 MG PO TABS: 4.0000 mg | ORAL_TABLET | Freq: Four times a day (QID) | ORAL | Status: DC | PRN

## 2019-10-22 MED ORDER — FENTANYL CITRATE (PF) 100 MCG/2ML IJ SOLN
25.0000 ug | INTRAMUSCULAR | Status: DC | PRN
  Administered 2019-10-22 (×3): 50 ug via INTRAVENOUS

## 2019-10-22 MED ORDER — LACTATED RINGERS IV SOLN: INTRAVENOUS | Status: DC

## 2019-10-22 MED ORDER — CELECOXIB 200 MG PO CAPS
400.0000 mg | ORAL_CAPSULE | ORAL | Status: AC
  Administered 2019-10-22: 400 mg via ORAL

## 2019-10-22 MED ORDER — OXYCODONE HCL 5 MG/5ML PO SOLN: 5.0000 mg | Freq: Once | ORAL | Status: AC | PRN

## 2019-10-22 MED ORDER — ACETAMINOPHEN 500 MG PO TABS
1000.0000 mg | ORAL_TABLET | ORAL | Status: AC
  Administered 2019-10-22: 1000 mg via ORAL

## 2019-10-22 MED ORDER — FENTANYL CITRATE (PF) 100 MCG/2ML IJ SOLN
INTRAMUSCULAR | Status: AC
  Filled 2019-10-22: qty 2

## 2019-10-22 MED ORDER — DEXAMETHASONE SODIUM PHOSPHATE 10 MG/ML IJ SOLN: 4.0000 mg | INTRAMUSCULAR | Status: DC

## 2019-10-22 MED ORDER — MEPERIDINE HCL 25 MG/ML IJ SOLN: 6.2500 mg | INTRAMUSCULAR | Status: DC | PRN

## 2019-10-22 MED ORDER — ONDANSETRON HCL 4 MG/2ML IJ SOLN
INTRAMUSCULAR | Status: AC
  Filled 2019-10-22: qty 2

## 2019-10-22 MED ORDER — MIDAZOLAM HCL 5 MG/5ML IJ SOLN
INTRAMUSCULAR | Status: DC | PRN
  Administered 2019-10-22: 2 mg via INTRAVENOUS

## 2019-10-22 MED ORDER — ONDANSETRON HCL 4 MG/2ML IJ SOLN: 4.0000 mg | Freq: Four times a day (QID) | INTRAMUSCULAR | Status: DC | PRN

## 2019-10-22 MED ORDER — GABAPENTIN 300 MG PO CAPS
300.0000 mg | ORAL_CAPSULE | ORAL | Status: AC
  Administered 2019-10-22: 300 mg via ORAL

## 2019-10-22 MED ORDER — SCOPOLAMINE 1 MG/3DAYS TD PT72
MEDICATED_PATCH | TRANSDERMAL | Status: AC
  Filled 2019-10-22: qty 1

## 2019-10-22 MED ORDER — TRANEXAMIC ACID 1000 MG/10ML IV SOLN
INTRAVENOUS | Status: DC | PRN
  Administered 2019-10-22: 1000 mg via INTRAVENOUS

## 2019-10-22 MED ORDER — PROMETHAZINE HCL 25 MG/ML IJ SOLN: 6.2500 mg | INTRAMUSCULAR | Status: DC | PRN

## 2019-10-22 MED ORDER — PROPOFOL 10 MG/ML IV BOLUS
INTRAVENOUS | Status: DC | PRN
  Administered 2019-10-22: 200 mg via INTRAVENOUS

## 2019-10-22 MED ORDER — LIDOCAINE 2% (20 MG/ML) 5 ML SYRINGE
INTRAMUSCULAR | Status: DC | PRN
  Administered 2019-10-22: 40 mg via INTRAVENOUS

## 2019-10-22 MED ORDER — ACETAMINOPHEN 500 MG PO TABS
ORAL_TABLET | ORAL | Status: AC
  Filled 2019-10-22: qty 2

## 2019-10-22 MED ORDER — IBUPROFEN 800 MG PO TABS
800.0000 mg | ORAL_TABLET | Freq: Three times a day (TID) | ORAL | 0 refills | Status: DC | PRN
Start: 2019-10-22 — End: 2019-11-01

## 2019-10-22 MED ORDER — LIDOCAINE 2% (20 MG/ML) 5 ML SYRINGE
INTRAMUSCULAR | Status: AC
  Filled 2019-10-22: qty 5

## 2019-10-22 MED ORDER — ACETAMINOPHEN 160 MG/5ML PO SOLN: 325.0000 mg | Freq: Once | ORAL | Status: DC | PRN

## 2019-10-22 MED ORDER — OXYCODONE HCL 5 MG PO TABS
ORAL_TABLET | ORAL | Status: AC
  Filled 2019-10-22: qty 1

## 2019-10-22 MED ORDER — HYDROMORPHONE HCL 1 MG/ML IJ SOLN
INTRAMUSCULAR | Status: AC
  Filled 2019-10-22: qty 1

## 2019-10-22 MED ORDER — GABAPENTIN 300 MG PO CAPS
ORAL_CAPSULE | ORAL | Status: AC
  Filled 2019-10-22: qty 1

## 2019-10-22 MED ORDER — OXYCODONE HCL 5 MG PO TABS: 5.0000 mg | ORAL_TABLET | ORAL | Status: DC | PRN

## 2019-10-22 MED ORDER — ACETAMINOPHEN 10 MG/ML IV SOLN: 1000.0000 mg | Freq: Once | INTRAVENOUS | Status: DC | PRN

## 2019-10-22 MED ORDER — DEXAMETHASONE SODIUM PHOSPHATE 10 MG/ML IJ SOLN
INTRAMUSCULAR | Status: DC | PRN
  Administered 2019-10-22: 4 mg via INTRAVENOUS

## 2019-10-22 MED ORDER — LEVONORGESTREL 20 MCG/24HR IU IUD
INTRAUTERINE_SYSTEM | INTRAUTERINE | Status: AC
  Administered 2019-10-22: 1 via INTRAUTERINE
  Filled 2019-10-22: qty 1

## 2019-10-22 MED ORDER — DEXAMETHASONE SODIUM PHOSPHATE 10 MG/ML IJ SOLN
INTRAMUSCULAR | Status: AC
  Filled 2019-10-22: qty 1

## 2019-10-22 MED ORDER — LIDOCAINE HCL 1 % IJ SOLN
INTRAMUSCULAR | Status: DC | PRN
  Administered 2019-10-22: 10 mL

## 2019-10-22 MED ORDER — SCOPOLAMINE 1 MG/3DAYS TD PT72
1.0000 | MEDICATED_PATCH | TRANSDERMAL | Status: DC
  Administered 2019-10-22: 1.5 mg via TRANSDERMAL

## 2019-10-22 MED ORDER — MIDAZOLAM HCL 2 MG/2ML IJ SOLN
INTRAMUSCULAR | Status: AC
  Filled 2019-10-22: qty 2

## 2019-10-22 MED ORDER — TRANEXAMIC ACID-NACL 1000-0.7 MG/100ML-% IV SOLN
INTRAVENOUS | Status: AC
  Filled 2019-10-22: qty 100

## 2019-10-22 MED ORDER — TRAMADOL HCL 50 MG PO TABS: 50.0000 mg | ORAL_TABLET | Freq: Four times a day (QID) | ORAL | Status: DC | PRN

## 2019-10-22 MED ORDER — FENTANYL CITRATE (PF) 250 MCG/5ML IJ SOLN
INTRAMUSCULAR | Status: DC | PRN
  Administered 2019-10-22 (×4): 25 ug via INTRAVENOUS

## 2019-10-22 MED ORDER — ONDANSETRON HCL 4 MG/2ML IJ SOLN
INTRAMUSCULAR | Status: DC | PRN
  Administered 2019-10-22: 4 mg via INTRAVENOUS

## 2019-10-22 MED ORDER — PROPOFOL 10 MG/ML IV BOLUS
INTRAVENOUS | Status: AC
  Filled 2019-10-22: qty 20

## 2019-10-22 MED ORDER — LEVONORGESTREL 20 MCG/24HR IU IUD
INTRAUTERINE_SYSTEM | INTRAUTERINE | Status: AC
  Filled 2019-10-22: qty 1

## 2019-10-22 MED ORDER — OXYCODONE HCL 5 MG PO TABS
5.0000 mg | ORAL_TABLET | Freq: Once | ORAL | Status: AC | PRN
  Administered 2019-10-22: 5 mg via ORAL

## 2019-10-22 MED ORDER — CELECOXIB 200 MG PO CAPS
ORAL_CAPSULE | ORAL | Status: AC
  Filled 2019-10-22: qty 2

## 2019-10-22 MED ORDER — ACETAMINOPHEN 325 MG PO TABS: 325.0000 mg | ORAL_TABLET | Freq: Once | ORAL | Status: DC | PRN

## 2019-10-22 SURGICAL SUPPLY — 19 items
CATH ROBINSON RED A/P 16FR (CATHETERS) ×4 IMPLANT
COVER WAND RF STERILE (DRAPES) ×4 IMPLANT
GAUZE 4X4 16PLY RFD (DISPOSABLE) ×2 IMPLANT
GLOVE BIO SURGEON STRL SZ 6 (GLOVE) ×8 IMPLANT
GOWN STRL REUS W/ TWL LRG LVL3 (GOWN DISPOSABLE) ×4 IMPLANT
GOWN STRL REUS W/TWL LRG LVL3 (GOWN DISPOSABLE) ×4
KIT TURNOVER CYSTO (KITS) IMPLANT
NDL SPNL 22GX3.5 QUINCKE BK (NEEDLE) ×2 IMPLANT
NEEDLE SPNL 22GX3.5 QUINCKE BK (NEEDLE) ×4 IMPLANT
NS IRRIG 1000ML POUR BTL (IV SOLUTION) ×4 IMPLANT
PACK VAGINAL MINOR WOMEN LF (CUSTOM PROCEDURE TRAY) ×4 IMPLANT
PAD OB MATERNITY 4.3X12.25 (PERSONAL CARE ITEMS) ×4 IMPLANT
PENCIL SMOKE EVACUATOR (MISCELLANEOUS) IMPLANT
SYR BULB IRRIG 60ML STRL (SYRINGE) ×2 IMPLANT
TOWEL OR 17X26 10 PK STRL BLUE (TOWEL DISPOSABLE) ×4 IMPLANT
TUBE CONNECTING 12'X1/4 (SUCTIONS) ×1
TUBE CONNECTING 12X1/4 (SUCTIONS) ×1 IMPLANT
UNDERPAD 30X36 HEAVY ABSORB (UNDERPADS AND DIAPERS) ×8 IMPLANT
YANKAUER SUCT BULB TIP NO VENT (SUCTIONS) ×2 IMPLANT

## 2019-10-22 NOTE — Anesthesia Procedure Notes (Signed)
Procedure Name: LMA Insertion Date/Time: 10/22/2019 8:47 AM Performed by: Myna Bright, CRNA Pre-anesthesia Checklist: Patient identified, Emergency Drugs available, Suction available and Patient being monitored Patient Re-evaluated:Patient Re-evaluated prior to induction Oxygen Delivery Method: Circle system utilized Preoxygenation: Pre-oxygenation with 100% oxygen Induction Type: IV induction Ventilation: Mask ventilation without difficulty LMA: LMA inserted LMA Size: 4.0 Number of attempts: 1 Placement Confirmation: positive ETCO2 and breath sounds checked- equal and bilateral Tube secured with: Tape Dental Injury: Teeth and Oropharynx as per pre-operative assessment

## 2019-10-22 NOTE — Interval H&P Note (Signed)
History and Physical Interval Note:  10/22/2019 7:54 AM  Shirley Pearson  has presented today for surgery, with the diagnosis of ENDOMETRIAL CANCER.  The various methods of treatment have been discussed with the patient and family. After consideration of risks, benefits and other options for treatment, the patient has consented to  Procedure(s): DILATATION AND CURETTAGE (N/A) INTRAUTERINE DEVICE (IUD) INSERTION AND POSSIBLE HYSTEROSCOPY (N/A) POSSIBLE OPERATIVE ULTRASOUND (N/A) as a surgical intervention.  The patient's history has been reviewed, patient examined, no change in status, stable for surgery.  I have reviewed the patient's chart and labs.  Questions were answered to the patient's satisfaction.     Lafonda Mosses

## 2019-10-22 NOTE — Anesthesia Preprocedure Evaluation (Signed)
Anesthesia Evaluation  Patient identified by MRN, date of birth, ID band Patient awake    Reviewed: Allergy & Precautions, NPO status , Patient's Chart, lab work & pertinent test results  Airway Mallampati: III  TM Distance: >3 FB Neck ROM: Full    Dental  (+) Teeth Intact, Dental Advisory Given   Pulmonary    breath sounds clear to auscultation       Cardiovascular negative cardio ROS   Rhythm:Regular Rate:Normal     Neuro/Psych    GI/Hepatic Neg liver ROS,   Endo/Other    Renal/GU negative Renal ROS     Musculoskeletal negative musculoskeletal ROS (+)   Abdominal Normal abdominal exam  (+)   Peds  Hematology   Anesthesia Other Findings   Reproductive/Obstetrics negative OB ROS                            Anesthesia Physical Anesthesia Plan  ASA: II  Anesthesia Plan: General   Post-op Pain Management:    Induction: Intravenous  PONV Risk Score and Plan: 4 or greater and Ondansetron, Dexamethasone, Midazolam and Scopolamine patch - Pre-op  Airway Management Planned: LMA  Additional Equipment: None  Intra-op Plan:   Post-operative Plan: Extubation in OR  Informed Consent: I have reviewed the patients History and Physical, chart, labs and discussed the procedure including the risks, benefits and alternatives for the proposed anesthesia with the patient or authorized representative who has indicated his/her understanding and acceptance.     Dental advisory given  Plan Discussed with: CRNA  Anesthesia Plan Comments:        Anesthesia Quick Evaluation

## 2019-10-22 NOTE — Transfer of Care (Signed)
Immediate Anesthesia Transfer of Care Note  Patient: Olen Pel  Procedure(s) Performed: DILATATION AND CURETTAGE (N/A Vagina ) INTRAUTERINE DEVICE (IUD) INSERTION (N/A Vagina ) OPERATIVE ULTRASOUND (N/A Abdomen)  Patient Location: PACU  Anesthesia Type:General  Level of Consciousness: awake, alert , oriented and patient cooperative  Airway & Oxygen Therapy: Patient Spontanous Breathing and Patient connected to face mask oxygen  Post-op Assessment: Report given to RN, Post -op Vital signs reviewed and stable and Patient moving all extremities  Post vital signs: Reviewed and stable  Last Vitals:  Vitals Value Taken Time  BP 156/102 10/22/19 0939  Temp    Pulse 118 10/22/19 0941  Resp 18 10/22/19 0941  SpO2 99 % 10/22/19 0941  Vitals shown include unvalidated device data.  Last Pain:  Vitals:   10/22/19 0727  TempSrc: Oral      Patients Stated Pain Goal: 4 (55/37/48 2707)  Complications: No apparent anesthesia complications

## 2019-10-22 NOTE — Discharge Instructions (Addendum)
Continue taking your provera once daily.  Plan to proceed with your scheduled MRI and we will contact you with the results. You can use ibuprofen prescribed for moderate pain if needed. You can take the senna-kot prescribed to prevent constipation.  Dilation and Curettage or Vacuum Curettage, Care After  These instructions give you information about caring for yourself after your procedure. Your doctor may also give you more specific instructions. Call your doctor if you have any problems or questions after your procedure. Follow these instructions at home: Activity  Do not drive or use heavy machinery while taking prescription pain medicine.  For 24 hours after your procedure, avoid driving.  Take short walks often, followed by rest periods. Ask your doctor what activities are safe for you. After one or two days, you may be able to return to your normal activities.  Do not lift anything that is heavier than 10 lb (4.5 kg) until your doctor approves.  For at least 2 weeks, or as long as told by your doctor: ? Do not douche. ? Do not use tampons. ? Do not have sex. General instructions   Take over-the-counter and prescription medicines only as told by your doctor. This is very important if you take blood thinning medicine.  Do not take baths, swim, or use a hot tub until your doctor approves. Take showers instead of baths.  Keep all follow-up visits as told by your doctor. This is important. Contact a doctor if:  You have very bad cramps that get worse or do not get better with medicine.  You have very bad pain in your belly (abdomen).  You cannot drink fluids without throwing up (vomiting).  You get pain in a different part of the area between your belly and thighs (pelvis).  You have bad-smelling discharge from your vagina.  You have a rash. Get help right away if:  You are bleeding a lot from your vagina. A lot of bleeding means soaking more than one sanitary pad in an  hour, for 2 hours in a row.  You have clumps of blood (blood clots) coming from your vagina.  You have a fever or chills.  Your belly feels very tender or hard.  You have chest pain.  You have trouble breathing.  You cough up blood.  You feel dizzy.  You feel light-headed.  You pass out (faint).  You have pain in your neck or shoulder area. Summary  Take short walks often, followed by rest periods. Ask your doctor what activities are safe for you. After one or two days, you may be able to return to your normal activities.  Do not lift anything that is heavier than 10 lb (4.5 kg) until your doctor approves.  Do not take baths, swim, or use a hot tub until your doctor approves. Take showers instead of baths.  Contact your doctor if you have any symptoms of infection, like bad-smelling discharge from your vagina. This information is not intended to replace advice given to you by your health care provider. Make sure you discuss any questions you have with your health care provider. Document Revised: 04/15/2017 Document Reviewed: 01/19/2016 Elsevier Patient Education  2020 [employer].   Dilatacin y curetaje o curetaje por aspiracin, cuidados posteriores Dilation and Curettage or Vacuum Curettage, Care After  Estas indicaciones le proporcionan informacin acerca de cmo deber cuidarse despus del procedimiento. El mdico tambin podr darle indicaciones ms especficas. Comunquese con el mdico si tiene algn problema o tiene preguntas  despus del procedimiento. Siga estas indicaciones en su casa: Actividad  No conduzca ni use maquinaria pesada mientras toma analgsicos recetados.  Evite conducir en las primeras 24horas despus del procedimiento.  Haga caminatas breves con frecuencia, seguidas de perodos de descanso. Pregntele al mdico qu actividades son seguras para usted. [place], quizs pueda retomar sus actividades normales.  No levante  ningn objeto que pese ms de 10libras (4,5kg) hasta que el mdico lo apruebe.  Por al menos 2semanas o el tiempo que le haya indicado el mdico: ? No se haga duchas vaginales. ? No use tampones. ? No tenga relaciones sexuales. Instrucciones generales   [employer] de venta libre y los recetados solamente como se lo haya indicado el mdico. Esto es muy importante si toma anticoagulantes.  No se d baos de inmersin, no practique natacin ni use el jacuzzi hasta que el mdico lo apruebe. Tome Dentist de baos.  Concurra a todas las visitas de control como se lo haya indicado el mdico. Esto es importante. Comunquese con un mdico si:  Tiene clicos muy fuertes que empeoran o no mejoran con los medicamentos.  Tiene dolor muy intenso en el vientre (abdomen).  No puede beber lquidos sin vomitar.  Tiene dolor en otra parte del rea entre el vientre y los muslos (pelvis).  Tiene secrecin vaginal con mal olor.  Tiene una erupcin cutnea. Solicite ayuda de inmediato si:  Tiene mucho sangrado de la vagina. Mucho sangrado significa empapar ms de una toalla higinica en una hora durante 2horas consecutivas.  Elimina grumos de sangre cogulos de sangre por la vagina.  Tiene fiebre o siente escalofros.  Siente dureza o dolor a Armed forces technical officer.  Siente dolor en el pecho.  Tiene dificultad para respirar.  Tose y escupe sangre.  Siente mareos.  Tiene sensacin de desvanecimiento.  Pierde el conocimiento (se desmaya).  Siente dolor en la zona del cuello o los hombros. Resumen  Haga caminatas breves con frecuencia, seguidas de perodos de descanso. Pregntele al mdico qu actividades son seguras para usted. [place], quizs pueda retomar sus actividades normales.  No levante ningn objeto que pese ms de 10libras (4,5kg) hasta que el mdico lo apruebe.  No se d baos de inmersin, no practique natacin ni use el  jacuzzi hasta que el mdico lo apruebe. Tome Dentist de baos.  Comunquese con el mdico si tiene sntomas de infeccin, como secrecin vaginal con mal olor. Esta informacin no tiene Marine scientist el consejo del mdico. Asegrese de hacerle al mdico cualquier pregunta que tenga. Document Revised: 11/04/2016 Document Reviewed: 11/30/2012 Elsevier Patient Education  [place] Anesthesia, Adult, Care After This sheet gives you information about how to care for yourself after your procedure. Your health care provider may also give you more specific instructions. If you have problems or questions, contact your health care provider. What can I expect after the procedure? After the procedure, the following side effects are common:  Pain or discomfort at the IV site.  Nausea.  Vomiting.  Sore throat.  Trouble concentrating.  Feeling cold or chills.  Weak or tired.  Sleepiness and fatigue.  Soreness and body aches. These side effects can affect parts of the body that were not involved in surgery. Follow these instructions at home:  For at least 24 hours after the procedure:  Have a responsible adult stay with you. It is important to  have someone help care for you until you are awake and alert.  Rest as needed.  Do not: ? Participate in activities in which you could fall or become injured. ? Drive. ? Use heavy machinery. ? Drink alcohol. ? Take sleeping pills or medicines that cause drowsiness. ? Make important decisions or sign legal documents. ? Take care of children on your own. Eating and drinking  Follow any instructions from your health care provider about eating or drinking restrictions.  When you feel hungry, start by eating small amounts of foods that are soft and easy to digest (bland), such as toast. Gradually return to your regular diet.  Drink enough fluid to keep your urine pale yellow.  If you vomit, rehydrate by drinking  water, juice, or clear broth. General instructions  If you have sleep apnea, surgery and certain medicines can increase your risk for breathing problems. Follow instructions from your health care provider about wearing your sleep device: ? Anytime you are sleeping, including during daytime naps. ? While taking prescription pain medicines, sleeping medicines, or medicines that make you drowsy.  Return to your normal activities as told by your health care provider. Ask your health care provider what activities are safe for you.  Take over-the-counter and prescription medicines only as told by your health care provider.  If you smoke, do not smoke without supervision.  Keep all follow-up visits as told by your health care provider. This is important. Contact a health care provider if:  You have nausea or vomiting that does not get better with medicine.  You cannot eat or drink without vomiting.  You have pain that does not get better with medicine.  You are unable to pass urine.  You develop a skin rash.  You have a fever.  You have redness around your IV site that gets worse. Get help right away if:  You have difficulty breathing.  You have chest pain.  You have blood in your urine or stool, or you vomit blood. Summary  After the procedure, it is common to have a sore throat or nausea. It is also common to feel tired.  Have a responsible adult stay with you for the first 24 hours after general anesthesia. It is important to have someone help care for you until you are awake and alert.  When you feel hungry, start by eating small amounts of foods that are soft and easy to digest (bland), such as toast. Gradually return to your regular diet.  Drink enough fluid to keep your urine pale yellow.  Return to your normal activities as told by your health care provider. Ask your health care provider what activities are safe for you. This information is not intended to replace  advice given to you by your health care provider. Make sure you discuss any questions you have with your health care provider. Document Revised: 05/06/2017 Document Reviewed: 12/17/2016 Elsevier Patient Education  [place] general en adultos, cuidados posteriores General Anesthesia, Adult, Care After Lea esta informacin sobre cmo cuidarse despus del procedimiento. El mdico tambin podr darle instrucciones ms especficas. Comunquese con su mdico si tiene problemas o preguntas. Qu puedo esperar despus del procedimiento? Luego del procedimiento, son comunes los siguiente efectos secundarios:  Dolor o Scientist, research (life sciences) en el lugar de la va intravenosa (i.v.).  Nuseas.  Vmitos.  Dolor de Investment banker, operational.  Dificultad para concentrarse.  Sentir fro o [employer].  Debilidad o cansancio.  Somnolencia y Programmer, applications.  Malestar y Hydrologist.  Estos efectos secundarios pueden afectar partes del cuerpo que no estuvieron involucradas en la ciruga. Siga estas indicaciones en su casa:  Durante al menos 24horas despus del procedimiento:  Pdale a un adulto responsable que permanezca con usted. Es importante que alguien cuide de usted hasta que se despierte y Cabin crew.  Descanse todo lo que sea necesario.  No haga lo siguiente: ? Participar en actividades en las que podra caerse o lastimarse. ? Conducir. ? Operar maquinarias pesadas. ? Beber alcohol. ? Tomar somnferos o medicamentos que causen somnolencia. ? Firmar documentos legales ni tomar [employer]. ? Cuidar a nios por su cuenta. Qu debe comer y beber  Siga las indicaciones del mdico respecto de las restricciones de comidas o bebidas.  Cuando [employer], comience a comer cantidades pequeas de alimentos que sean blandos y fciles de Publishing copy (livianos), como una tostada. Retome su dieta habitual de forma gradual.  Beba suficiente lquido como para mantener la orina de color amarillo  plido.  Si vomita, rehidrtese tomando agua, jugo o caldo transparente. Instrucciones generales  Si tiene apnea del sueo, la [place] y ciertos medicamentos pueden aumentar el riesgo de problemas respiratorios. Siga las indicaciones del mdico respecto al uso de su dispositivo para dormir: ? Siempre que duerma, incluso durante las siestas que tome en el da. ? Mientras tome analgsicos recetados, medicamentos para dormir o medicamentos que producen somnolencia.  Reanude sus actividades normales segn lo indicado por el mdico. Pregntele al mdico qu actividades son seguras para usted.  Tome los medicamentos de venta libre y los recetados solamente como se lo haya indicado el mdico.  Si fuma, no lo haga sin supervisin.  Concurra a todas las visitas de seguimiento como se lo haya indicado el mdico. Esto es importante. Comunquese con un mdico si:  Tiene nuseas o vmitos que no mejoran con medicamentos.  No puede comer ni beber sin vomitar.  El dolor no se alivia con medicamentos.  No puede orinar.  Tiene una erupcin cutnea.  Tiene fiebre.  Presenta enrojecimiento alrededor del lugar de la va intravenosa (i.v.) que empeora. Solicite ayuda de inmediato si:  Tiene dificultad para respirar.  Siente dolor en el pecho.  Observa sangre en la orina o heces, o vomita sangre. Resumen  Despus del procedimiento, es comn tener dolor de garganta y nuseas. Tambin es comn sentirse cansado.  Pdale a un adulto responsable que permanezca con usted durante 24 horas despus de la anestesia general. Es importante que alguien cuide de usted hasta que se despierte y Cabin crew.  Cuando [employer], comience a comer cantidades pequeas de alimentos que sean blandos y fciles de Publishing copy (livianos), como una tostada. Retome su dieta habitual de forma gradual.  Beba suficiente lquido como para mantener la orina de color amarillo plido.  Reanude sus actividades normales segn lo  indicado por el mdico. Pregntele al mdico qu actividades son seguras para usted. Esta informacin no tiene Marine scientist el consejo del mdico. Asegrese de hacerle al mdico cualquier pregunta que tenga. Document Revised: 02/28/2017 Document Reviewed: 02/28/2017 Elsevier Patient Education  2020 [employer].

## 2019-10-22 NOTE — Op Note (Signed)
PATIENT: Shirley Pearson DATE: 10/22/19  Preop Diagnosis: Endometrial cancer, FIGO grade 1, desires fertility preservation  Postoperative Diagnosis: same as above  Surgery: D&C (dilation and curettage) under ultrasound assistance, Mirena IUD insertion (LOT [id number], exp 10/2021)  Surgeons:  Jeral Pinch, MD Assistant: Joylene John, NP  Anesthesia: General   Estimated blood loss: 500 ml  IVF:  See flowsheet   Urine output: 75 ml   Complications: none apparent  Pathology: endometrial curetteings  Operative findings: On EUA, 12-14cm mobile uterus, bulbous LUS. Uterus sounded to 10cm. Significant tumor and blood noted on D&C with thickened endometrial lining on ultrasound assistance. At the end of the procedure, good uterine cri noted. Mirena IUD,   Procedure: The patient was identified in the preoperative holding area. Informed consent was signed on the chart. Patient was seen history was reviewed and exam was performed.   The patient was then taken to the operating room and placed in the supine position with SCD hose on. General anesthesia was then induced without difficulty. She was then placed in the dorsolithotomy position. The perineum was prepped with Betadine. The vagina was prepped with Betadine. The patient was then draped after the prep was dried. A red robin catheter to empty the bladder was performed under aseptic conditions.  Timeout was performed the patient, procedure, antibiotic, allergy, and length of procedure.   The speculum was placed in the vagina. The single tooth tenaculum was placed on the anterior lip of the cervix. The uterine sound was placed in the cervix and advanced to the fundus. The cervix was successively dilated using pratts dilators to 14F. A sharp curette was advanced to the fundus and a comprehensive curette of the endometrial cavity took place until a gritty feel was appreciated. The specimen was collected on a telfa and sent for  permanent pathology.  Due to significant bleeding during the first half of the procedure, a dose of TXA was requested and administered. Minimal bleeding noted at the completion of D&C.  Mirena IUD was then inserted to the uterine fundus, deployed, and IUD strings were cut at 3 cm. The tenaculum was removed and hemostasis was observed.   The vagina was irrigated copiously.  All instrument, suture, laparotomy, Ray-Tec, and needle counts were correct x2.  The patient tolerated the procedure well and was taken recovery room in stable condition.   Jeral Pinch MD Gynecologic Oncology

## 2019-10-22 NOTE — Anesthesia Postprocedure Evaluation (Signed)
Anesthesia Post Note  Patient: Olen Pel  Procedure(s) Performed: DILATATION AND CURETTAGE (N/A Vagina ) INTRAUTERINE DEVICE (IUD) INSERTION (N/A Vagina ) OPERATIVE ULTRASOUND (N/A Abdomen)     Patient location during evaluation: PACU Anesthesia Type: General Level of consciousness: awake and alert Pain management: pain level controlled Vital Signs Assessment: post-procedure vital signs reviewed and stable Respiratory status: spontaneous breathing, nonlabored ventilation, respiratory function stable and patient connected to nasal cannula oxygen Cardiovascular status: blood pressure returned to baseline and stable Postop Assessment: no apparent nausea or vomiting Anesthetic complications: no    Last Vitals:  Vitals:   10/22/19 1000 10/22/19 1015  BP: (!) 152/90 132/88  Pulse: 86 84  Resp: 12 15  Temp:    SpO2: 96% 99%    Last Pain:  Vitals:   10/22/19 1030  TempSrc:   PainSc: Lowman Zaharah Amir

## 2019-10-23 ENCOUNTER — Telehealth: Payer: Self-pay

## 2019-10-23 NOTE — Telephone Encounter (Signed)
Ms Douthitt states that she is eating and drinking well. She has some burning with urination. Told her to take in at least 64 oz of fluid daily to flush kidneys.  The burning should decrease in the next 24 hrs. The irritation is from the foley catheter from the procedure. If the burning is not gone by tomorrow morning, she needs to call the office as she may need to give a urine specimen to make sure that she does not have a UTI. Afebrile No BM. Pt passing gas. She does not have senokot-s at home. Told her to have some one pick it up to help move bowels. She can take 1-2 tabs daily as needed. She is experiencing some abdominal cramping. The ibuprofen is helping with the discomfort. Told her she can add tylenol in between ibuprofen. Not to exceed 4000 mg in a 24 hour period. She is experiencing light vaginal bleeding. She is aware of her appointments and the office number [phone] to call if she has any questions or concerns.

## 2019-10-29 ENCOUNTER — Inpatient Hospital Stay: Payer: Self-pay | Attending: Gynecologic Oncology | Admitting: Gynecologic Oncology

## 2019-10-29 ENCOUNTER — Encounter: Payer: Self-pay | Admitting: Gynecologic Oncology

## 2019-10-29 DIAGNOSIS — C541 Malignant neoplasm of endometrium: Secondary | ICD-10-CM

## 2019-10-29 NOTE — H&P (View-Only) (Signed)
Gynecologic Oncology Telehealth Consult Note: Gyn-Onc  I connected with Shirley Pearson on 10/29/19 at  9:00 AM EDT by telephone and verified that I am speaking with the correct person using two identifiers.  I discussed the limitations, risks, security and privacy concerns of performing an evaluation and management service by telemedicine and the availability of in-person appointments. I also discussed with the patient that there may be a patient responsible charge related to this service. The patient expressed understanding and agreed to proceed.  Other persons participating in the visit and their role in the encounter: none.  Patient's location: home Provider's location: hospital  Reason for Visit: follow-up after surgery  Treatment History: Oncology History  Endometrial cancer ([place])  10/08/2019 Initial Biopsy   EMB - FIGO gr1 endometrioid adenoca  MMR IHC intact   10/10/2019 Initial Diagnosis   Endometrial cancer ([place])     Interval History: Doing well since surgery a week ago. She notes that bleeding slowed down by the end of last week and now is light. Denies nausea, emesis, fevers or chills. Endorses normal bowel and bladder function. Is planning to return to work tomorrow.   Past Medical/Surgical History: Past Medical History:  Diagnosis Date  . Abnormal uterine bleeding   . Anemia   . Complex atypical endometrial hyperplasia   . Obesity (BMI 30-39.9)     Past Surgical History:  Procedure Laterality Date  . DILATION AND CURETTAGE OF UTERUS  yrs ago  . DILATION AND CURETTAGE OF UTERUS N/A 10/22/2019   Procedure: DILATATION AND CURETTAGE;  Surgeon: Lafonda Mosses, MD;  Location: [facility];  Service: Gynecology;  Laterality: N/A;  . INTRAUTERINE DEVICE (IUD) INSERTION N/A 10/22/2019   Procedure: INTRAUTERINE DEVICE (IUD) INSERTION;  Surgeon: Lafonda Mosses, MD;  Location: [facility];  Service: Gynecology;  Laterality: N/A;  .  OPERATIVE ULTRASOUND N/A 10/22/2019   Procedure: OPERATIVE ULTRASOUND;  Surgeon: Lafonda Mosses, MD;  Location: [facility];  Service: Gynecology;  Laterality: N/A;    Family History  Problem Relation Age of Onset  . Diabetes Mother   . Colon cancer Neg Hx   . Breast cancer Neg Hx   . Ovarian cancer Neg Hx   . Uterine cancer Neg Hx     Social History   Socioeconomic History  . Marital status: Single    Spouse name: Not on file  . Number of children: Not on file  . Years of education: Not on file  . Highest education level: Not on file  Occupational History  . Occupation: Press photographer at [employer]  Tobacco Use  . Smoking status: Never Smoker  . Smokeless tobacco: Never Used  Vaping Use  . Vaping Use: Never used  Substance and Sexual Activity  . Alcohol use: Not Currently  . Drug use: Never  . Sexual activity: Not Currently    Birth control/protection: Abstinence  Other Topics Concern  . Not on file  Social History Narrative  . Not on file   Social Determinants of Health   Financial Resource Strain:   . Difficulty of Paying Living Expenses:   Food Insecurity:   . Worried About Charity fundraiser in the Last Year:   . Arboriculturist in the Last Year:   Transportation Needs:   . Film/video editor (Medical):   Marland Kitchen Lack of Transportation (Non-Medical):   Physical Activity:   . Days of Exercise per Week:   . Minutes of Exercise  per Session:   Stress:   . Feeling of Stress :   Social Connections:   . Frequency of Communication with Friends and Family:   . Frequency of Social Gatherings with Friends and Family:   . Attends Religious Services:   . Active Member of Clubs or Organizations:   . Attends Archivist Meetings:   Marland Kitchen Marital Status:     Current Medications:  Current Outpatient Medications:  .  Ferrous Sulfate (IRON PO), Take by mouth., Disp: , Rfl:  .  ibuprofen (ADVIL) 800 MG tablet, Take 1 tablet (800 mg total) by mouth  every 8 (eight) hours as needed for moderate pain., Disp: 30 tablet, Rfl: 0 .  medroxyPROGESTERone (PROVERA) 10 MG tablet, Take 10 mg by mouth daily., Disp: , Rfl:  .  senna (SENOKOT) 8.6 MG TABS tablet, Take 1 tablet (8.6 mg total) by mouth daily as needed for up to 120 doses for mild constipation., Disp: 30 tablet, Rfl: 3  Review of Symptoms: Review of systems is negative  Physical Exam: LMP 10/16/2019  Not performed given limitations of phone visit  Laboratory & Radiologic Studies: SURGICAL PATHOLOGY  CASE: [id number]  PATIENT: Shirley Pearson  Surgical Pathology Report      Clinical History: Endometrial cancer ([facility])      FINAL MICROSCOPIC DIAGNOSIS:   A. ENDOMETRIUM, CURETTAGE:  - Endometrioid adenocarcinoma.  - See comment.   COMMENT:   The adenocarcinoma appears FIGO grade I. The adenocarcinoma appears to  be involving underlying polyp(s). Dr. Claudette Laws has reviewed the  case and concurs with this interpretation.   Assessment & Plan: Shirley Pearson is a 39 y.o. 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   Doing very well post-op. Ran out of Provera at the end of last week, discussed plan to see how her bleeding is with just the IUD. She is having an MRI Wednesday to evaluate for myometrial invasion. I am hopeful that carcinoma is confined to endometrium and/or polyps. If no MI, will plan for hormonal management of her endometrial cancer and fertility preservation.   I discussed the assessment and treatment plan with the patient. The patient was provided with an opportunity to ask questions and all were answered. The patient agreed with the plan and demonstrated an understanding of the instructions.   The patient was advised to call back or see an in-person evaluation if the symptoms worsen or if the condition fails to improve as  anticipated.   20 minutes of total time was spent for this patient encounter, including preparation, face-to-face counseling with the patient and coordination of care, and documentation of the encounter.   Jeral Pinch, MD  Division of Gynecologic Oncology  Department of Obstetrics and Gynecology  [facility]

## 2019-10-29 NOTE — Progress Notes (Signed)
Gynecologic Oncology Telehealth Consult Note: Gyn-Onc  I connected with Shirley Pearson on 10/29/19 at  9:00 AM EDT by telephone and verified that I am speaking with the correct person using two identifiers.  I discussed the limitations, risks, security and privacy concerns of performing an evaluation and management service by telemedicine and the availability of in-person appointments. I also discussed with the patient that there may be a patient responsible charge related to this service. The patient expressed understanding and agreed to proceed.  Other persons participating in the visit and their role in the encounter: none.  Patient's location: home Provider's location: hospital  Reason for Visit: follow-up after surgery  Treatment History: Oncology History  Endometrial cancer ([facility])  10/08/2019 Initial Biopsy   EMB - FIGO gr1 endometrioid adenoca  MMR IHC intact   10/10/2019 Initial Diagnosis   Endometrial cancer ([facility])     Interval History: Doing well since surgery a week ago. She notes that bleeding slowed down by the end of last week and now is light. Denies nausea, emesis, fevers or chills. Endorses normal bowel and bladder function. Is planning to return to work tomorrow.   Past Medical/Surgical History: Past Medical History:  Diagnosis Date  . Abnormal uterine bleeding   . Anemia   . Complex atypical endometrial hyperplasia   . Obesity (BMI 30-39.9)     Past Surgical History:  Procedure Laterality Date  . DILATION AND CURETTAGE OF UTERUS  yrs ago  . DILATION AND CURETTAGE OF UTERUS N/A 10/22/2019   Procedure: DILATATION AND CURETTAGE;  Surgeon: Suleman Gunning R, MD;  Location: [facility];  Service: Gynecology;  Laterality: N/A;  . INTRAUTERINE DEVICE (IUD) INSERTION N/A 10/22/2019   Procedure: INTRAUTERINE DEVICE (IUD) INSERTION;  Surgeon: Adah Stoneberg R, MD;  Location: [facility];  Service: Gynecology;  Laterality: N/A;  .  OPERATIVE ULTRASOUND N/A 10/22/2019   Procedure: OPERATIVE ULTRASOUND;  Surgeon: Aamir Mclinden R, MD;  Location: [facility];  Service: Gynecology;  Laterality: N/A;    Family History  Problem Relation Age of Onset  . Diabetes Mother   . Colon cancer Neg Hx   . Breast cancer Neg Hx   . Ovarian cancer Neg Hx   . Uterine cancer Neg Hx     Social History   Socioeconomic History  . Marital status: Single    Spouse name: Not on file  . Number of children: Not on file  . Years of education: Not on file  . Highest education level: Not on file  Occupational History  . 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 Smoking status: Never Smoker  . Smokeless tobacco: Never Used  Vaping Use  . Vaping Use: Never used  Substance and Sexual Activity  . Alcohol use: Not Currently  . Drug use: Never  . Sexual activity: Not Currently    Birth control/protection: Abstinence  Other Topics Concern  . Not on file  Social History Narrative  . Not on file   Social Determinants of Health   Financial Resource Strain:   . Difficulty of Paying Living Expenses:   Food Insecurity:   . Worried About Running Out of Food in the Last Year:   . Ran Out of Food in the Last Year:   Transportation Needs:   . Lack of Transportation (Medical):   . Lack of Transportation (Non-Medical):   Physical Activity:   . Days of Exercise per Week:   . Minutes of Exercise   per Session:   Stress:   . Feeling of Stress :   Social Connections:   . Frequency of Communication with Friends and Family:   . Frequency of Social Gatherings with Friends and Family:   . Attends Religious Services:   . Active Member of Clubs or Organizations:   . Attends Archivist Meetings:   Marland Kitchen Marital Status:     Current Medications:  Current Outpatient Medications:  .  Ferrous Sulfate (IRON PO), Take by mouth., Disp: , Rfl:  .  ibuprofen (ADVIL) 800 MG tablet, Take 1 tablet (800 mg total) by mouth  every 8 (eight) hours as needed for moderate pain., Disp: 30 tablet, Rfl: 0 .  medroxyPROGESTERone (PROVERA) 10 MG tablet, Take 10 mg by mouth daily., Disp: , Rfl:  .  senna (SENOKOT) 8.6 MG TABS tablet, Take 1 tablet (8.6 mg total) by mouth daily as needed for up to 120 doses for mild constipation., Disp: 30 tablet, Rfl: 3  Review of Symptoms: Review of systems is negative  Physical Exam: LMP 10/16/2019  Not performed given limitations of phone visit  Laboratory & Radiologic Studies: SURGICAL PATHOLOGY  CASE: [id number]  PATIENT: Shirley Pearson  Surgical Pathology Report      Clinical History: Endometrial cancer ([facility])      FINAL MICROSCOPIC DIAGNOSIS:   A. ENDOMETRIUM, CURETTAGE:  - Endometrioid adenocarcinoma.  - See comment.   COMMENT:   The adenocarcinoma appears FIGO grade I. The adenocarcinoma appears to  be involving underlying polyp(s). Dr. Claudette Laws has reviewed the  case and concurs with this interpretation.   Assessment & Plan: Shirley Pearson is a 39 y.o. 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   Doing very well post-op. Ran out of Provera at the end of last week, discussed plan to see how her bleeding is with just the IUD. She is having an MRI Wednesday to evaluate for myometrial invasion. I am hopeful that carcinoma is confined to endometrium and/or polyps. If no MI, will plan for hormonal management of her endometrial cancer and fertility preservation.   I discussed the assessment and treatment plan with the patient. The patient was provided with an opportunity to ask questions and all were answered. The patient agreed with the plan and demonstrated an understanding of the instructions.   The patient was advised to call back or see an in-person evaluation if the symptoms worsen or if the condition fails to improve as  anticipated.   20 minutes of total time was spent for this patient encounter, including preparation, face-to-face counseling with the patient and coordination of care, and documentation of the encounter.   Jeral Pinch, MD  Division of Gynecologic Oncology  Department of Obstetrics and Gynecology  [facility]

## 2019-10-30 ENCOUNTER — Inpatient Hospital Stay: Payer: Self-pay | Admitting: Gynecologic Oncology

## 2019-10-30 LAB — SURGICAL PATHOLOGY

## 2019-10-31 ENCOUNTER — Other Ambulatory Visit: Payer: Self-pay

## 2019-10-31 ENCOUNTER — Ambulatory Visit (HOSPITAL_COMMUNITY)
Admission: RE | Admit: 2019-10-31 | Discharge: 2019-10-31 | Disposition: A | Discharge status: Home / Self Care | Payer: Self-pay | Source: Ambulatory Visit | Attending: Gynecologic Oncology | Admitting: Gynecologic Oncology

## 2019-10-31 ENCOUNTER — Telehealth: Payer: Self-pay | Admitting: Gynecologic Oncology

## 2019-10-31 DIAGNOSIS — C541 Malignant neoplasm of endometrium: Secondary | ICD-10-CM | POA: Insufficient documentation

## 2019-10-31 MED ORDER — GADOBUTROL 1 MMOL/ML IV SOLN
7.0000 mL | Freq: Once | INTRAVENOUS | Status: AC | PRN
  Administered 2019-10-31: 7 mL via INTRAVENOUS

## 2019-10-31 NOTE — Telephone Encounter (Signed)
Discussed MRI findings concerning for deep MI. Given MI is a contraindication to fertility sparring management in the setting of uterine cancer, discussed recommendation to move forward with scheduling robotic staging surgery. Patient would like to speak with her husband and asks that I call back later this afternoon.  Jeral Pinch MD Gynecologic Oncology

## 2019-11-01 ENCOUNTER — Other Ambulatory Visit: Payer: Self-pay | Admitting: Gynecologic Oncology

## 2019-11-01 ENCOUNTER — Telehealth: Payer: Self-pay | Admitting: *Deleted

## 2019-11-01 DIAGNOSIS — C541 Malignant neoplasm of endometrium: Secondary | ICD-10-CM

## 2019-11-01 MED ORDER — IBUPROFEN 800 MG PO TABS: 800.0000 mg | ORAL_TABLET | Freq: Three times a day (TID) | ORAL | 0 refills | Status: DC | PRN

## 2019-11-01 MED ORDER — OXYCODONE HCL 5 MG PO TABS: 5.0000 mg | ORAL_TABLET | ORAL | 0 refills | Status: DC | PRN

## 2019-11-01 NOTE — Progress Notes (Deleted)
Patient here *** for a pre-operative appointment prior to her scheduled surgery on ***. She is scheduled for a ***.  She had her pre-admission testing appointment this am at [facility].  The surgery was discussed in detail.  See after visit summary for additional details. Visual aids used to discuss items related to surgery including the incentive spirometer, sequential compression stockings, foley catheter, IV pump, multi-modal pain regimen including tylenol, photo of the surgical robot, female reproductive system to discuss surgery in detail.      Discussed post-op pain management in detail including the aspects of the enhanced recovery pathway.  Advised her that a new prescription would be sent in for *** and it is only to be used for after her upcoming surgery.  We discussed the use of tylenol post-op and to monitor for a maximum of 4,000 mg in a 24 hour period.  Also prescribed sennakot to be used after surgery and to hold if having loose stools.  Discussed bowel regimen in detail.     Discussed the use of lovenox pre-op, SCDs, and measures to take at home to prevent DVT including frequent mobility.  Reportable signs and symptoms of DVT discussed. Post-operative instructions discussed and expectations for after surgery. Incisional care discussed as well including reportable signs and symptoms including erythema, drainage, wound separation.     *** minutes spent with the patient.  Verbalizing understanding of material discussed. No needs or concerns voiced at the end of the visit.   Advised patient and family to call for any needs.  Advised that her post-operative medications had been prescribed and could be picked up at any time.    

## 2019-11-01 NOTE — Patient Instructions (Signed)
Preparing for your Surgery  Plan for surgery on November 13, 2019 with Dr. Jeral Pinch at [place] will be scheduled for a robotic assisted total laparoscopic hysterectomy (removal of uterus and cervix), bilateral salpingectomy (removal of both fallopian tubes), sentinel lymph node biopsy, possible lymph node dissection, possible bilateral oophorectomy, possible laparotomy.    Use Tylenol for pain before surgery and avoid ibuprofen 800 mg use starting Monday, November 05, 2019 until after surgery.  Pre-operative Testing -You will receive a phone call from presurgical testing at [facility] to arrange for a pre-operative appointment over the phone, lab appointment, and COVID test. The COVID test normally happens 3 days prior to the surgery and they ask that you self quarantine after the test up until surgery to decrease chance of exposure.  -Bring your insurance card, copy of an advanced directive if applicable, medication list  -At that visit, you will be asked to sign a consent for a possible blood transfusion in case a transfusion becomes necessary during surgery.  The need for a blood transfusion is rare but having consent is a necessary part of your care.     -You should not be taking blood thinners or aspirin at least ten days prior to surgery unless instructed by your surgeon.  -Do not take supplements such as fish oil (omega 3), red yeast rice, turmeric before your surgery.   Day Before Surgery at [place] will be asked to take in a light diet the day before surgery. You will be advised you can have clear liquids after midnight and up until 3 hours before your surgery.    Eat a light diet the day before surgery.  Examples including soups, broths, toast, yogurt, mashed potatoes.  AVOID GAS PRODUCING FOODS. Things to avoid include carbonated beverages (fizzy beverages), raw fruits and raw vegetables, or beans.   If your bowels are filled with gas, your surgeon will  have difficulty visualizing your pelvic organs which increases your surgical risks.  Your role in recovery Your role is to become active as soon as directed by your doctor, while still giving yourself time to heal.  Rest when you feel tired. You will be asked to do the following in order to speed your recovery:  - Cough and breathe deeply. This helps to clear and expand your lungs and can prevent pneumonia after surgery.  - [place]. Do mild physical activity. Walking or moving your legs help your circulation and body functions return to normal. Do not try to get up or walk alone the first time after surgery.   -If you develop swelling on one leg or the other, pain in the back of your leg, redness/warmth in one of your legs, please call the office or go to the Emergency Room to have a doppler to rule out a blood clot. For shortness of breath, chest pain-seek care in the Emergency Room as soon as possible. - Actively manage your pain. Managing your pain lets you move in comfort. We will ask you to rate your pain on a scale of zero to 10. It is your responsibility to tell your doctor or nurse where and how much you hurt so your pain can be treated.  Special Considerations -If you are diabetic, you may be placed on insulin after surgery to have closer control over your blood sugars to promote healing and recovery.  This does not mean that you will be discharged on insulin.  If  applicable, your oral antidiabetics will be resumed when you are tolerating a solid diet.  -Your final pathology results from surgery should be available around one week after surgery and the results will be relayed to you when available.  -Dr. Lahoma Crocker is the surgeon that assists your GYN Oncologist with surgery.  If you end up staying the night, the next day after your surgery you will either see Dr. Denman George, Dr. Berline Lopes, or Dr. Lahoma Crocker.  -FMLA forms can be faxed to [phone] and please  allow 5-7 business days for completion.  Pain Management After Surgery -You have been prescribed your pain medication and bowel regimen medications before surgery so that you can have these available when you are discharged from the hospital. The pain medication is for use ONLY AFTER surgery and a new prescription will not be given.   -Make sure that you have Tylenol and Ibuprofen at home to use on a regular basis after surgery for pain control. We recommend alternating the medications every hour to six hours since they work differently and are processed in the body differently for pain relief.  -Review the attached handout on narcotic use and their risks and side effects.   Bowel Regimen -You have been prescribed Sennakot-S to take nightly to prevent constipation especially if you are taking the narcotic pain medication intermittently.  It is important to prevent constipation and drink adequate amounts of liquids. You can stop taking this medication when you are not taking pain medication and you are back on your normal bowel routine.  Risks of Surgery Risks of surgery are low but include bleeding, infection, damage to surrounding structures, re-operation, blood clots, and very rarely death.   Blood Transfusion Information (For the consent to be signed before surgery)  We will be checking your blood type before surgery so in case of emergencies, we will know what type of blood you would need.                                            WHAT IS A BLOOD TRANSFUSION?  A transfusion is the replacement of blood or some of its parts. Blood is made up of multiple cells which provide different functions.  Red blood cells carry oxygen and are used for blood loss replacement.  White blood cells fight against infection.  Platelets control bleeding.  Plasma helps clot blood.  Other blood products are available for specialized needs, such as hemophilia or other clotting disorders. BEFORE THE  TRANSFUSION  Who gives blood for transfusions?   You may be able to donate blood to be used at a later date on yourself (autologous donation).  Relatives can be asked to donate blood. This is generally not any safer than if you have received blood from a stranger. The same precautions are taken to ensure safety when a relative's blood is donated.  Healthy volunteers who are fully evaluated to make sure their blood is safe. This is blood bank blood. Transfusion therapy is the safest it has ever been in the practice of medicine. Before blood is taken from a donor, a complete history is taken to make sure that person has no history of diseases nor engages in risky social behavior (examples are intravenous drug use or sexual activity with multiple partners). The donor's travel history is screened to minimize risk of transmitting infections, such as malaria. The  donated blood is tested for signs of infectious diseases, such as HIV and hepatitis. The blood is then tested to be sure it is compatible with you in order to minimize the chance of a transfusion reaction. If you or a relative donates blood, this is often done in anticipation of surgery and is not appropriate for emergency situations. It takes many days to process the donated blood. RISKS AND COMPLICATIONS Although transfusion therapy is very safe and saves many lives, the main dangers of transfusion include:   Getting an infectious disease.  Developing a transfusion reaction. This is an allergic reaction to something in the blood you were given. Every precaution is taken to prevent this. The decision to have a blood transfusion has been considered carefully by your caregiver before blood is given. Blood is not given unless the benefits outweigh the risks.  AFTER SURGERY INSTRUCTIONS  Return to work: 4-6 weeks if applicable  Activity: 1. Be up and out of the bed during the day.  Take a nap if needed.  You may walk up steps but be careful and  use the hand rail.  Stair climbing will tire you more than you think, you may need to stop part way and rest.   2. No lifting or straining for 6 weeks over 10 pounds. No pushing, pulling, straining for 6 weeks.  3. No driving for 1 week(s).  Do not drive if you are taking narcotic pain medicine and make sure that your reaction time has returned.   4. You can shower as soon as the next day after surgery. Shower daily.  Use soap and water on your incision and pat dry; don't rub.  No tub baths or submerging your body in water until cleared by your surgeon. If you have the soap that was given to you by pre-surgical testing that was used before surgery, you do not need to use it afterwards because this can irritate your incisions.   5. No sexual activity and nothing in the vagina for 8 weeks.  6. You may experience a small amount of clear drainage from your incisions, which is normal.  If the drainage persists, increases, or changes color please call the office.  7. Do not use creams, lotions, or ointments such as neosporin on your incisions after surgery until advised by your surgeon because they can cause removal of the dermabond glue on your incisions.    8. You may experience vaginal spotting after surgery or around the 6-8 week mark from surgery when the stitches at the top of the vagina begin to dissolve.  The spotting is normal but if you experience heavy bleeding, call our office.  9. Take Tylenol or ibuprofen first for pain and only use narcotic pain medication for severe pain not relieved by the Tylenol or Ibuprofen.  Monitor your Tylenol intake to a max of 4,000 mg in a 24 hour period. You can alternate these medications after surgery.  Diet: 1. Low sodium Heart Healthy Diet is recommended.  2. It is safe to use a laxative, such as Miralax or Colace, if you have difficulty moving your bowels. You have been prescribed Sennakot at bedtime every evening to keep bowel movements regular and to  prevent constipation.    Wound Care: 1. Keep clean and dry.  Shower daily.  Reasons to call the Doctor:  Fever - Oral temperature greater than 100.4 degrees Fahrenheit  Foul-smelling vaginal discharge  Difficulty urinating  Nausea and vomiting  Increased pain at the  site of the incision that is unrelieved with pain medicine.  Difficulty breathing with or without chest pain  New calf pain especially if only on one side  Sudden, continuing increased vaginal bleeding with or without clots.   Contacts: For questions or concerns you should contact:  Dr. Jeral Pinch at [phone]  Joylene John, NP at [phone]  After Hours: call [phone] and have the GYN Oncologist paged/contacted

## 2019-11-01 NOTE — Telephone Encounter (Signed)
Spoke with Shirley Pearson and told her that Dr. Berline Lopes wants to do her surgery on Tuesday 11-13-19. She agreed to come into the office tomorrow at 1200 to see Joylene John, NP for a pre-op visit. Appointment scheduled.

## 2019-11-01 NOTE — Progress Notes (Signed)
Pre-op meds prescribed.

## 2019-11-01 NOTE — Telephone Encounter (Signed)
Patient called requesting to speak with Melissa APP regarding surgery date for 6/24

## 2019-11-02 ENCOUNTER — Encounter: Payer: Self-pay | Admitting: Gynecologic Oncology

## 2019-11-05 ENCOUNTER — Other Ambulatory Visit: Payer: Self-pay

## 2019-11-05 ENCOUNTER — Inpatient Hospital Stay: Payer: Self-pay

## 2019-11-05 DIAGNOSIS — C541 Malignant neoplasm of endometrium: Secondary | ICD-10-CM

## 2019-11-05 NOTE — Progress Notes (Unsigned)
Patient here  for a pre-operative appointment prior to her scheduled surgery on June 29,20-21 with Dr. Jeral Pinch She is scheduled for a robotic assisted total laparoscopic hysterectomy, bilateral salpingectomy,  SLN Bx, possible lymph node dissection, possible bilateral oophorectomy, and possible laparotomy. She will have  her pre-admission testing appointment Wednesday at [facility].  The surgery was discussed in detail.  See after visit summary for additional details. Visual aids used to discuss items related to surgery including the incentive spirometer, sequential compression stockings, foley catheter, IV pump, multi-modal pain regimen including tylenol, photo of the surgical robot, female reproductive system to discuss surgery in detail.      Discussed post-op pain management in detail including the aspects of the enhanced recovery pathway.  Advised her that a new prescription would be sent in for Oxycodone and  Ibuprofen and it is only to be used for after her upcoming surgery.  We discussed the use of tylenol post-op and to monitor for a maximum of 4,000 mg in a 24 hour period.  Also prescribed sennakot S to be used after surgery and to hold if having loose stools.  Discussed bowel regimen in detail.     Discussed the use of lovenox pre-op, SCDs, and measures to take at home to prevent DVT including frequent mobility.  Reportable signs and symptoms of DVT discussed. Post-operative instructions discussed and expectations for after surgery. Incisional care discussed as well including reportable signs and symptoms including erythema, drainage, wound separation.    20 minutes spent with the patient.  Verbalizing understanding of material discussed. No needs or concerns voiced at the end of the visit.   Advised patient and family to call for any needs.  Advised that her post-operative medications had been prescribed and could be picked up at any time.   Shirley Pearson will pick up note on Wednesday 11-07-19  for her employer confirming surgery and recovery time.

## 2019-11-05 NOTE — Patient Instructions (Addendum)
DUE TO COVID-19 ONLY ONE VISITOR IS ALLOWED TO COME WITH YOU AND STAY IN THE WAITING ROOM ONLY DURING PRE OP AND PROCEDURE DAY OF SURGERY. THE 1 VISITOR MAY VISIT WITH YOU AFTER SURGERY IN YOUR PRIVATE ROOM DURING VISITING HOURS ONLY!  YOU NEED TO HAVE A COVID 19 TEST ON_6/25______ @_11 :30______, THIS TEST MUST BE DONE BEFORE SURGERY, COME  [address], [place] , [address].  ([place]) ONCE YOUR COVID TEST IS COMPLETED, PLEASE BEGIN THE QUARANTINE INSTRUCTIONS AS OUTLINED IN YOUR HANDOUT.                Shirley Pearson    Your procedure is scheduled on: 11/13/19   Report to [facility] Main  Entrance   Report to admitting at  8:00 AM     Call this number if you have problems the morning of surgery [phone]   The day before surgery eat light:  Full Liquid Diet   Strained creamy soups Tea, Coffee- with cream or mild and sugar or honey  Juices- cranberry , grape and apple  Jello  Milkshakes  Pudding , custards  Popsicles  Water Plain ice cream f, frozen yogurt, sherbet, plain yogurt  Fruit ices and popsicles with no fruit pulp  Sugar, honey and syrups Clear broths  Boost, Ensure, Resource and other liquid supplements NO CARBONATED BEVERAGES      Remember: Do not eat food after Midnight.  You may have clear liquid until 7:00 am the morning of surgery   CLEAR LIQUID DIET   Foods Allowed                                                                     Foods Excluded  Coffee and tea, regular and decaf                             liquids that you cannot  Plain Jell-O any favor except red or purple                                           see through such as: Fruit ices (not with fruit pulp)                                     milk, soups, orange juice  Iced Popsicles                                    All solid food Carbonated beverages, regular and diet                                    Cranberry, grape and apple juices Sports  drinks like Gatorade Lightly seasoned clear broth or consume(fat free) Sugar, honey syrup   BRUSH YOUR TEETH MORNING OF SURGERY AND RINSE YOUR MOUTH OUT, NO CHEWING GUM CANDY OR MINTS.  Take these medicines the morning of surgery with A SIP OF WATER: Provera(medroxyprogesterone)                                You may not have any metal on your body including hair pins and              piercings             Do not wear jewelry, make-up, lotions, powders or perfumes, deodorant              Do not wear nail polish on your fingernails.  Do not shave  48 hours prior to surgery.               Do not bring valuables to the hospital. Center.  Contacts, dentures or bridgework may not be worn into surgery.  Leave suitcase in the car. After surgery it may be brought to your room.     Patients discharged the day of surgery will not be allowed to drive home.   IF YOU ARE HAVING SURGERY AND GOING HOME THE SAME DAY, YOU MUST HAVE AN ADULT TO DRIVE YOU HOME AND BE WITH YOU FOR 24 HOURS.   YOU MAY GO HOME BY TAXI OR UBER OR ORTHERWISE, BUT AN ADULT MUST ACCOMPANY YOU HOME AND STAY WITH YOU FOR 24 HOURS.  Name and phone number of your driver:  Special Instructions: N/A              Please read over the following fact sheets you were given: _____________________________________________________________________             [facility] - Preparing for Surgery  Before surgery, you can play an important role.   Because skin is not sterile, your skin needs to be as free of germs as possible.   You can reduce the number of germs on your skin by washing with CHG (chlorahexidine gluconate) soap before surgery.   CHG is an antiseptic cleaner which kills germs and bonds with the skin to continue killing germs even after washing. Please DO NOT use if you have an allergy to CHG or antibacterial soaps.   If your skin becomes reddened/irritated stop  using the CHG and inform your nurse when you arrive at Short Stay. Do not shave (including legs and underarms) for at least 48 hours prior to the first CHG shower.   . Please follow these instructions carefully:  1.  Shower with CHG Soap the night before surgery and the  morning of Surgery.  2.  If you choose to wash your hair, wash your hair first as usual with your  normal  shampoo.  3.  After you shampoo, rinse your hair and body thoroughly to remove the  shampoo.                                        4.  Use CHG as you would any other liquid soap.  You can apply chg directly  to the skin and wash                       Gently with a scrungie or clean washcloth.  5.  Apply the CHG Soap to your body ONLY FROM THE NECK DOWN.   Do not use on face/ open                           Wound or open sores. Avoid contact with eyes, ears mouth and genitals (private parts).                       Wash face,  Genitals (private parts) with your normal soap.             6.  Wash thoroughly, paying special attention to the area where your surgery  will be performed.  7.  Thoroughly rinse your body with warm water from the neck down.  8.  DO NOT shower/wash with your normal soap after using and rinsing off  the CHG Soap.              9.  Pat yourself dry with a clean towel.            10.  Wear clean pajamas.            11.  Place clean sheets on your bed the night of your first shower and do not  sleep with pets. Day of Surgery : Do not apply any lotions/deodorants the morning of surgery.  Please wear clean clothes to the hospital/surgery center.  FAILURE TO FOLLOW THESE INSTRUCTIONS MAY RESULT IN THE CANCELLATION OF YOUR SURGERY PATIENT SIGNATURE_________________________________  NURSE SIGNATURE__________________________________  ________________________________________________________________________

## 2019-11-06 ENCOUNTER — Other Ambulatory Visit: Payer: Self-pay

## 2019-11-06 ENCOUNTER — Encounter (HOSPITAL_COMMUNITY): Payer: Self-pay

## 2019-11-06 ENCOUNTER — Encounter (HOSPITAL_COMMUNITY)
Admission: RE | Admit: 2019-11-06 | Discharge: 2019-11-06 | Disposition: A | Discharge status: Home / Self Care | Payer: Self-pay | Source: Ambulatory Visit | Attending: Gynecologic Oncology | Admitting: Gynecologic Oncology

## 2019-11-06 DIAGNOSIS — Z01812 Encounter for preprocedural laboratory examination: Secondary | ICD-10-CM | POA: Insufficient documentation

## 2019-11-06 NOTE — Progress Notes (Signed)
COVID Vaccine Completed:No Date COVID Vaccine completed: COVID vaccine manufacturer: [place]   PCP - R. Patsey Berthold Cardiologist - no  Chest x-ray - no EKG - no Stress Test - no ECHO - no Cardiac Cath - no  Sleep Study - no CPAP -   Fasting Blood Sugar - NA Checks Blood Sugar _____ times a day  Blood Thinner Instructions:NA Aspirin Instructions: Last Dose:  Anesthesia review:   Patient denies shortness of breath, fever, cough and chest pain at PAT appointment  yes   Patient verbalized understanding of instructions that were given to them at the PAT appointment. Patient was also instructed that they will need to review over the PAT instructions again at home before surgery.  Yes  Pt's husband will be with her on DOS. He only speaks spanish. Interpreter will be available for post op

## 2019-11-07 ENCOUNTER — Encounter (HOSPITAL_COMMUNITY)
Admission: RE | Admit: 2019-11-07 | Discharge: 2019-11-07 | Disposition: A | Discharge status: Home / Self Care | Payer: Self-pay | Source: Ambulatory Visit | Attending: Gynecologic Oncology | Admitting: Gynecologic Oncology

## 2019-11-07 LAB — URINALYSIS, ROUTINE W REFLEX MICROSCOPIC
Bilirubin Urine: NEGATIVE
Glucose, UA: NEGATIVE mg/dL
Ketones, ur: NEGATIVE mg/dL
Leukocytes,Ua: NEGATIVE
Nitrite: NEGATIVE
Protein, ur: 30 mg/dL — AB
RBC / HPF: >50 RBC/hpf — ABNORMAL HIGH (ref 0–5)
Specific Gravity, Urine: 1.028 (ref 1.005–1.030)
pH: 5 (ref 5.0–8.0)

## 2019-11-07 LAB — CBC
HCT: 34.3 % — ABNORMAL LOW (ref 36.0–46.0)
Hemoglobin: 10.8 g/dL — ABNORMAL LOW (ref 12.0–15.0)
MCH: 25.7 pg — ABNORMAL LOW (ref 26.0–34.0)
MCHC: 31.5 g/dL (ref 30.0–36.0)
MCV: 81.5 fL (ref 80.0–100.0)
Platelets: 470 10*3/uL — ABNORMAL HIGH (ref 150–400)
RBC: 4.21 MIL/uL (ref 3.87–5.11)
RDW: 17.7 % — ABNORMAL HIGH (ref 11.5–15.5)
WBC: 6.3 10*3/uL (ref 4.0–10.5)
nRBC: 0 % (ref 0.0–0.2)

## 2019-11-07 LAB — COMPREHENSIVE METABOLIC PANEL
ALT: 18 U/L (ref 0–44)
AST: 14 U/L — ABNORMAL LOW (ref 15–41)
Albumin: 3.7 g/dL (ref 3.5–5.0)
Alkaline Phosphatase: 53 U/L (ref 38–126)
Anion gap: 7 (ref 5–15)
BUN: 13 mg/dL (ref 6–20)
CO2: 25 mmol/L (ref 22–32)
Calcium: 8.3 mg/dL — ABNORMAL LOW (ref 8.9–10.3)
Chloride: 106 mmol/L (ref 98–111)
Creatinine, Ser: 0.48 mg/dL (ref 0.44–1.00)
GFR calc Af Amer: >60 mL/min (ref >60)
GFR calc non Af Amer: >60 mL/min (ref >60)
Glucose, Bld: 133 mg/dL — ABNORMAL HIGH (ref 70–99)
Potassium: 4.4 mmol/L (ref 3.5–5.1)
Sodium: 138 mmol/L (ref 135–145)
Total Bilirubin: 0.2 mg/dL — ABNORMAL LOW (ref 0.3–1.2)
Total Protein: 6.9 g/dL (ref 6.5–8.1)

## 2019-11-07 LAB — PREGNANCY, URINE: Preg Test, Ur: NEGATIVE

## 2019-11-09 ENCOUNTER — Other Ambulatory Visit (HOSPITAL_COMMUNITY)
Admission: RE | Admit: 2019-11-09 | Discharge: 2019-11-09 | Disposition: A | Discharge status: Home / Self Care | Payer: HRSA Program | Source: Ambulatory Visit | Attending: Gynecologic Oncology | Admitting: Gynecologic Oncology

## 2019-11-09 DIAGNOSIS — Z01812 Encounter for preprocedural laboratory examination: Secondary | ICD-10-CM | POA: Insufficient documentation

## 2019-11-09 DIAGNOSIS — Z20822 Contact with and (suspected) exposure to covid-19: Secondary | ICD-10-CM | POA: Diagnosis not present

## 2019-11-09 LAB — SARS CORONAVIRUS 2 (TAT 6-24 HRS): SARS Coronavirus 2: NEGATIVE

## 2019-11-12 ENCOUNTER — Telehealth: Payer: Self-pay

## 2019-11-12 NOTE — Telephone Encounter (Signed)
Pt states that she understands the written pre op instructions and has her three post op medication. She does not have any more Provera. Instructions state to take this the morning of surgery. Told her that she does not need to be concerned as she is having the hysterectomy tomorrow. Pt verbalized understanding.

## 2019-11-13 ENCOUNTER — Encounter (HOSPITAL_COMMUNITY)
Admission: RE | Disposition: A | Discharge status: Home / Self Care | Payer: Self-pay | Source: Ambulatory Visit | Attending: Gynecologic Oncology

## 2019-11-13 ENCOUNTER — Encounter (HOSPITAL_COMMUNITY): Payer: Self-pay | Admitting: Gynecologic Oncology

## 2019-11-13 ENCOUNTER — Other Ambulatory Visit: Payer: Self-pay

## 2019-11-13 ENCOUNTER — Ambulatory Visit (HOSPITAL_COMMUNITY)
Admission: RE | Admit: 2019-11-13 | Discharge: 2019-11-13 | Disposition: A | Discharge status: Home / Self Care | Payer: Self-pay | Source: Ambulatory Visit | Attending: Gynecologic Oncology | Admitting: Gynecologic Oncology

## 2019-11-13 ENCOUNTER — Ambulatory Visit (HOSPITAL_COMMUNITY): Payer: Self-pay | Admitting: Certified Registered Nurse Anesthetist

## 2019-11-13 DIAGNOSIS — D649 Anemia, unspecified: Secondary | ICD-10-CM | POA: Insufficient documentation

## 2019-11-13 DIAGNOSIS — Z6830 Body mass index (BMI) 30.0-30.9, adult: Secondary | ICD-10-CM | POA: Insufficient documentation

## 2019-11-13 DIAGNOSIS — Z79899 Other long term (current) drug therapy: Secondary | ICD-10-CM | POA: Insufficient documentation

## 2019-11-13 DIAGNOSIS — N800 Endometriosis of uterus: Secondary | ICD-10-CM | POA: Insufficient documentation

## 2019-11-13 DIAGNOSIS — C541 Malignant neoplasm of endometrium: Secondary | ICD-10-CM | POA: Insufficient documentation

## 2019-11-13 HISTORY — PX: ROBOTIC ASSISTED TOTAL HYSTERECTOMY: SHX6085

## 2019-11-13 LAB — PREGNANCY, URINE: Preg Test, Ur: NEGATIVE

## 2019-11-13 LAB — TYPE AND SCREEN
ABO/RH(D): B POS
Antibody Screen: NEGATIVE

## 2019-11-13 SURGERY — HYSTERECTOMY, TOTAL, ROBOT-ASSISTED
Anesthesia: General

## 2019-11-13 MED ORDER — FENTANYL CITRATE (PF) 250 MCG/5ML IJ SOLN
INTRAMUSCULAR | Status: AC
  Filled 2019-11-13: qty 5

## 2019-11-13 MED ORDER — OXYCODONE HCL 5 MG PO TABS: 5.0000 mg | ORAL_TABLET | Freq: Once | ORAL | Status: DC | PRN

## 2019-11-13 MED ORDER — HYDROMORPHONE HCL 1 MG/ML IJ SOLN
INTRAMUSCULAR | Status: DC | PRN
  Administered 2019-11-13 (×4): .5 mg via INTRAVENOUS

## 2019-11-13 MED ORDER — PROPOFOL 10 MG/ML IV BOLUS
INTRAVENOUS | Status: DC | PRN
  Administered 2019-11-13: 150 mg via INTRAVENOUS

## 2019-11-13 MED ORDER — CHLORHEXIDINE GLUCONATE 0.12 % MT SOLN
15.0000 mL | Freq: Once | OROMUCOSAL | Status: AC
  Administered 2019-11-13: 15 mL via OROMUCOSAL

## 2019-11-13 MED ORDER — HYDROMORPHONE HCL 1 MG/ML IJ SOLN
INTRAMUSCULAR | Status: AC
  Filled 2019-11-13: qty 1

## 2019-11-13 MED ORDER — MIDAZOLAM HCL 5 MG/5ML IJ SOLN
INTRAMUSCULAR | Status: DC | PRN
  Administered 2019-11-13: 2 mg via INTRAVENOUS

## 2019-11-13 MED ORDER — LACTATED RINGERS IV SOLN: INTRAVENOUS | Status: DC

## 2019-11-13 MED ORDER — ONDANSETRON HCL 4 MG/2ML IJ SOLN
INTRAMUSCULAR | Status: DC | PRN
  Administered 2019-11-13: 4 mg via INTRAVENOUS

## 2019-11-13 MED ORDER — GABAPENTIN 300 MG PO CAPS
300.0000 mg | ORAL_CAPSULE | ORAL | Status: AC
  Administered 2019-11-13: 300 mg via ORAL
  Filled 2019-11-13: qty 1

## 2019-11-13 MED ORDER — FENTANYL CITRATE (PF) 250 MCG/5ML IJ SOLN
INTRAMUSCULAR | Status: DC | PRN
  Administered 2019-11-13 (×2): 50 ug via INTRAVENOUS
  Administered 2019-11-13: 100 ug via INTRAVENOUS
  Administered 2019-11-13: 50 ug via INTRAVENOUS

## 2019-11-13 MED ORDER — STERILE WATER FOR IRRIGATION IR SOLN
Status: DC | PRN
  Administered 2019-11-13: 1000 mL

## 2019-11-13 MED ORDER — STERILE WATER FOR INJECTION IJ SOLN
INTRAMUSCULAR | Status: DC | PRN
  Administered 2019-11-13: 4 mL

## 2019-11-13 MED ORDER — LACTATED RINGERS IR SOLN
Status: DC | PRN
  Administered 2019-11-13: 1

## 2019-11-13 MED ORDER — HYDROMORPHONE HCL 1 MG/ML IJ SOLN
0.2500 mg | INTRAMUSCULAR | Status: DC | PRN
  Administered 2019-11-13: 0.25 mg via INTRAVENOUS
  Administered 2019-11-13 (×2): 0.5 mg via INTRAVENOUS

## 2019-11-13 MED ORDER — SCOPOLAMINE 1 MG/3DAYS TD PT72
1.0000 | MEDICATED_PATCH | TRANSDERMAL | Status: DC
  Administered 2019-11-13: 1.5 mg via TRANSDERMAL
  Filled 2019-11-13: qty 1

## 2019-11-13 MED ORDER — ROCURONIUM BROMIDE 10 MG/ML (PF) SYRINGE
PREFILLED_SYRINGE | INTRAVENOUS | Status: AC
  Filled 2019-11-13: qty 10

## 2019-11-13 MED ORDER — LIDOCAINE 2% (20 MG/ML) 5 ML SYRINGE
INTRAMUSCULAR | Status: DC | PRN
  Administered 2019-11-13: 100 mg via INTRAVENOUS

## 2019-11-13 MED ORDER — SODIUM CHLORIDE 0.9% FLUSH: 3.0000 mL | Freq: Two times a day (BID) | INTRAVENOUS | Status: DC

## 2019-11-13 MED ORDER — ORAL CARE MOUTH RINSE: 15.0000 mL | Freq: Once | OROMUCOSAL | Status: AC

## 2019-11-13 MED ORDER — PROPOFOL 10 MG/ML IV BOLUS
INTRAVENOUS | Status: AC
  Filled 2019-11-13: qty 20

## 2019-11-13 MED ORDER — LIDOCAINE 2% (20 MG/ML) 5 ML SYRINGE
INTRAMUSCULAR | Status: DC | PRN
  Administered 2019-11-13: 1.5 mg/kg/h via INTRAVENOUS

## 2019-11-13 MED ORDER — STERILE WATER FOR INJECTION IJ SOLN
INTRAMUSCULAR | Status: AC
  Filled 2019-11-13: qty 10

## 2019-11-13 MED ORDER — ROCURONIUM BROMIDE 10 MG/ML (PF) SYRINGE
PREFILLED_SYRINGE | INTRAVENOUS | Status: DC | PRN
  Administered 2019-11-13: 70 mg via INTRAVENOUS
  Administered 2019-11-13: 10 mg via INTRAVENOUS
  Administered 2019-11-13: 20 mg via INTRAVENOUS

## 2019-11-13 MED ORDER — KETAMINE HCL 10 MG/ML IJ SOLN
INTRAMUSCULAR | Status: DC | PRN
  Administered 2019-11-13: 30 mg via INTRAVENOUS

## 2019-11-13 MED ORDER — ONDANSETRON HCL 4 MG/2ML IJ SOLN
INTRAMUSCULAR | Status: AC
  Filled 2019-11-13: qty 2

## 2019-11-13 MED ORDER — LIDOCAINE 2% (20 MG/ML) 5 ML SYRINGE
INTRAMUSCULAR | Status: AC
  Filled 2019-11-13: qty 5

## 2019-11-13 MED ORDER — DEXAMETHASONE SODIUM PHOSPHATE 10 MG/ML IJ SOLN
INTRAMUSCULAR | Status: DC | PRN
  Administered 2019-11-13: 10 mg via INTRAVENOUS

## 2019-11-13 MED ORDER — CELECOXIB 200 MG PO CAPS
400.0000 mg | ORAL_CAPSULE | ORAL | Status: AC
  Administered 2019-11-13: 400 mg via ORAL
  Filled 2019-11-13: qty 2

## 2019-11-13 MED ORDER — BUPIVACAINE HCL 0.25 % IJ SOLN
INTRAMUSCULAR | Status: AC
  Filled 2019-11-13: qty 1

## 2019-11-13 MED ORDER — 0.9 % SODIUM CHLORIDE (POUR BTL) OPTIME
TOPICAL | Status: DC | PRN
  Administered 2019-11-13: 1000 mL

## 2019-11-13 MED ORDER — HEPARIN SODIUM (PORCINE) 5000 UNIT/ML IJ SOLN
5000.0000 [IU] | INTRAMUSCULAR | Status: AC
  Administered 2019-11-13: 5000 [IU] via SUBCUTANEOUS
  Filled 2019-11-13: qty 1

## 2019-11-13 MED ORDER — HYDROMORPHONE HCL 2 MG/ML IJ SOLN
INTRAMUSCULAR | Status: AC
  Filled 2019-11-13: qty 1

## 2019-11-13 MED ORDER — BUPIVACAINE HCL 0.25 % IJ SOLN
INTRAMUSCULAR | Status: DC | PRN
  Administered 2019-11-13: 50 mL

## 2019-11-13 MED ORDER — SUGAMMADEX SODIUM 500 MG/5ML IV SOLN
INTRAVENOUS | Status: AC
  Filled 2019-11-13: qty 5

## 2019-11-13 MED ORDER — DEXAMETHASONE SODIUM PHOSPHATE 4 MG/ML IJ SOLN: 4.0000 mg | INTRAMUSCULAR | Status: DC

## 2019-11-13 MED ORDER — CEFAZOLIN SODIUM-DEXTROSE 2-4 GM/100ML-% IV SOLN
2.0000 g | INTRAVENOUS | Status: AC
  Administered 2019-11-13: 2 g via INTRAVENOUS
  Filled 2019-11-13: qty 100

## 2019-11-13 MED ORDER — MIDAZOLAM HCL 2 MG/2ML IJ SOLN
INTRAMUSCULAR | Status: AC
  Filled 2019-11-13: qty 2

## 2019-11-13 MED ORDER — OXYCODONE HCL 5 MG/5ML PO SOLN: 5.0000 mg | Freq: Once | ORAL | Status: DC | PRN

## 2019-11-13 MED ORDER — SUGAMMADEX SODIUM 200 MG/2ML IV SOLN
INTRAVENOUS | Status: DC | PRN
  Administered 2019-11-13: 200 mg via INTRAVENOUS

## 2019-11-13 MED ORDER — ACETAMINOPHEN 500 MG PO TABS
1000.0000 mg | ORAL_TABLET | ORAL | Status: AC
  Administered 2019-11-13: 1000 mg via ORAL
  Filled 2019-11-13: qty 2

## 2019-11-13 MED ORDER — DEXAMETHASONE SODIUM PHOSPHATE 10 MG/ML IJ SOLN
INTRAMUSCULAR | Status: AC
  Filled 2019-11-13: qty 1

## 2019-11-13 SURGICAL SUPPLY — 70 items
APPLICATOR SURGIFLO ENDO (HEMOSTASIS) IMPLANT
BACTOSHIELD CHG 4% 4OZ (MISCELLANEOUS) ×2
BAG LAPAROSCOPIC 12 15 PORT 16 (BASKET) ×1 IMPLANT
BAG RETRIEVAL 12/15 (BASKET) ×2
BAG RETRIEVAL 12/15MM (BASKET) ×1
BLADE SURG SZ10 CARB STEEL (BLADE) IMPLANT
COVER BACK TABLE 60X90IN (DRAPES) ×3 IMPLANT
COVER TIP SHEARS 8 DVNC (MISCELLANEOUS) ×1 IMPLANT
COVER TIP SHEARS 8MM DA VINCI (MISCELLANEOUS) ×2
COVER WAND RF STERILE (DRAPES) IMPLANT
DECANTER SPIKE VIAL GLASS SM (MISCELLANEOUS) ×6 IMPLANT
DERMABOND ADVANCED (GAUZE/BANDAGES/DRESSINGS) ×2
DERMABOND ADVANCED .7 DNX12 (GAUZE/BANDAGES/DRESSINGS) ×1 IMPLANT
DRAPE ARM DVNC X/XI (DISPOSABLE) ×4 IMPLANT
DRAPE COLUMN DVNC XI (DISPOSABLE) ×1 IMPLANT
DRAPE DA VINCI XI ARM (DISPOSABLE) ×8
DRAPE DA VINCI XI COLUMN (DISPOSABLE) ×2
DRAPE SHEET LG 3/4 BI-LAMINATE (DRAPES) ×6 IMPLANT
DRAPE SURG IRRIG POUCH 19X23 (DRAPES) ×3 IMPLANT
DRSG OPSITE POSTOP 4X6 (GAUZE/BANDAGES/DRESSINGS) IMPLANT
DRSG OPSITE POSTOP 4X8 (GAUZE/BANDAGES/DRESSINGS) ×3 IMPLANT
ELECT PENCIL ROCKER SW 15FT (MISCELLANEOUS) ×3 IMPLANT
ELECT REM PT RETURN 15FT ADLT (MISCELLANEOUS) ×3 IMPLANT
GLOVE BIO SURGEON STRL SZ 6 (GLOVE) ×12 IMPLANT
GLOVE BIO SURGEON STRL SZ 6.5 (GLOVE) ×4 IMPLANT
GLOVE BIO SURGEONS STRL SZ 6.5 (GLOVE) ×2
GOWN STRL REUS W/ TWL LRG LVL3 (GOWN DISPOSABLE) ×4 IMPLANT
GOWN STRL REUS W/TWL LRG LVL3 (GOWN DISPOSABLE) ×8
HOLDER FOLEY CATH W/STRAP (MISCELLANEOUS) ×3 IMPLANT
IRRIG SUCT STRYKERFLOW 2 WTIP (MISCELLANEOUS) ×3
IRRIGATION SUCT STRKRFLW 2 WTP (MISCELLANEOUS) ×1 IMPLANT
KIT PROCEDURE DA VINCI SI (MISCELLANEOUS) ×2
KIT PROCEDURE DVNC SI (MISCELLANEOUS) ×1 IMPLANT
KIT TURNOVER KIT A (KITS) IMPLANT
MANIPULATOR UTERINE 4.5 ZUMI (MISCELLANEOUS) ×3 IMPLANT
NEEDLE HYPO 21X1.5 SAFETY (NEEDLE) ×3 IMPLANT
NEEDLE SPNL 18GX3.5 QUINCKE PK (NEEDLE) ×3 IMPLANT
OBTURATOR OPTICAL STANDARD 8MM (TROCAR) ×2
OBTURATOR OPTICAL STND 8 DVNC (TROCAR) ×1
OBTURATOR OPTICALSTD 8 DVNC (TROCAR) ×1 IMPLANT
PACK ROBOT GYN WLCUSTOM (TRAY / TRAY PROCEDURE) ×3 IMPLANT
PAD POSITIONING PINK XL (MISCELLANEOUS) ×3 IMPLANT
PENCIL SMOKE EVACUATOR (MISCELLANEOUS) IMPLANT
PORT ACCESS TROCAR AIRSEAL 12 (TROCAR) ×1 IMPLANT
PORT ACCESS TROCAR AIRSEAL 5M (TROCAR) ×2
POUCH SPECIMEN RETRIEVAL 10MM (ENDOMECHANICALS) IMPLANT
SCRUB CHG 4% DYNA-HEX 4OZ (MISCELLANEOUS) ×1 IMPLANT
SEAL CANN UNIV 5-8 DVNC XI (MISCELLANEOUS) ×4 IMPLANT
SEAL XI 5MM-8MM UNIVERSAL (MISCELLANEOUS) ×8
SET TRI-LUMEN FLTR TB AIRSEAL (TUBING) ×3 IMPLANT
SPONGE LAP 18X18 RF (DISPOSABLE) ×3 IMPLANT
SURGIFLO W/THROMBIN 8M KIT (HEMOSTASIS) IMPLANT
SUT MNCRL AB 4-0 PS2 18 (SUTURE) IMPLANT
SUT PDS AB 1 TP1 96 (SUTURE) ×6 IMPLANT
SUT VIC AB 0 CT1 27 (SUTURE)
SUT VIC AB 0 CT1 27XBRD ANTBC (SUTURE) IMPLANT
SUT VIC AB 2-0 CT1 27 (SUTURE)
SUT VIC AB 2-0 CT1 TAPERPNT 27 (SUTURE) IMPLANT
SUT VIC AB 2-0 SH 27 (SUTURE) ×2
SUT VIC AB 2-0 SH 27X BRD (SUTURE) ×1 IMPLANT
SUT VICRYL 4-0 PS2 18IN ABS (SUTURE) ×6 IMPLANT
SYR 10ML LL (SYRINGE) IMPLANT
TOWEL OR NON WOVEN STRL DISP B (DISPOSABLE) ×3 IMPLANT
TRAP SPECIMEN MUCUS 40CC (MISCELLANEOUS) IMPLANT
TRAY FOLEY MTR SLVR 16FR STAT (SET/KITS/TRAYS/PACK) ×3 IMPLANT
TROCAR XCEL NON-BLD 5MMX100MML (ENDOMECHANICALS) IMPLANT
UNDERPAD 30X36 HEAVY ABSORB (UNDERPADS AND DIAPERS) ×6 IMPLANT
WATER STERILE IRR 1000ML POUR (IV SOLUTION) ×3 IMPLANT
YANKAUER SUCT BULB TIP 10FT TU (MISCELLANEOUS) ×3 IMPLANT
YANKAUER SUCT BULB TIP NO VENT (SUCTIONS) ×3 IMPLANT

## 2019-11-13 NOTE — Anesthesia Preprocedure Evaluation (Signed)
Anesthesia Evaluation  Patient identified by MRN, date of birth, ID band Patient awake    Reviewed: Allergy & Precautions, NPO status , Patient's Chart, lab work & pertinent test results  Airway Mallampati: II  TM Distance: >3 FB Neck ROM: Full    Dental no notable dental hx.    Pulmonary neg pulmonary ROS,    Pulmonary exam normal breath sounds clear to auscultation       Cardiovascular negative cardio ROS Normal cardiovascular exam Rhythm:Regular Rate:Normal     Neuro/Psych negative neurological ROS  negative psych ROS   GI/Hepatic negative GI ROS, Neg liver ROS,   Endo/Other  negative endocrine ROS  Renal/GU negative Renal ROS  negative genitourinary   Musculoskeletal negative musculoskeletal ROS (+)   Abdominal   Peds negative pediatric ROS (+)  Hematology  (+) anemia ,   Anesthesia Other Findings   Reproductive/Obstetrics negative OB ROS                             Anesthesia Physical Anesthesia Plan  ASA: II  Anesthesia Plan: General   Post-op Pain Management:    Induction: Intravenous  PONV Risk Score and Plan: 3 and Ondansetron, Dexamethasone, Midazolam and Treatment may vary due to age or medical condition  Airway Management Planned: Oral ETT  Additional Equipment:   Intra-op Plan:   Post-operative Plan: Extubation in OR  Informed Consent: I have reviewed the patients History and Physical, chart, labs and discussed the procedure including the risks, benefits and alternatives for the proposed anesthesia with the patient or authorized representative who has indicated his/her understanding and acceptance.     Dental advisory given  Plan Discussed with: CRNA and Surgeon  Anesthesia Plan Comments:         Anesthesia Quick Evaluation

## 2019-11-13 NOTE — Progress Notes (Signed)
Patient unable to void post surgery, Dr. Berline Lopes made aware . To proceed for patient discharge .

## 2019-11-13 NOTE — Discharge Instructions (Signed)
11/13/2019  Return to work: 4-6  Activity: 1. Be up and out of the bed during the day.  Take a nap if needed.  You may walk up steps but be careful and use the hand rail.  Stair climbing will tire you more than you think, you may need to stop part way and rest.   2. No lifting or straining for 6 weeks.  3. No driving for 1-2 weeks.  Do Not drive if you are taking narcotic pain medicine and until you can brake safely.  4. Shower daily.  Use soap and water on your incision and pat dry; don't rub.   5. No sexual activity and nothing in the vagina for 8 weeks.  Medications:  - Take ibuprofen and tylenol first line for pain control. Take these regularly (every 6 hours) to decrease the build up of pain.  - If necessary, for severe pain not relieved by ibuprofen, take oxycodone.  - While taking percocet you should take sennakot every night to reduce the likelihood of constipation. If this causes diarrhea, stop its use.  Diet: 1. Low sodium Heart Healthy Diet is recommended.  2. It is safe to use a laxative if you have difficulty moving your bowels.   Wound Care: 1. Keep clean and dry.  Shower daily.  Reasons to call the Doctor:   Fever - Oral temperature greater than 100.4 degrees Fahrenheit  Foul-smelling vaginal discharge  Difficulty urinating  Nausea and vomiting  Increased pain at the site of the incision that is unrelieved with pain medicine.  Difficulty breathing with or without chest pain  New calf pain especially if only on one side  Sudden, continuing increased vaginal bleeding with or without clots.   Follow-up: 1. See Jeral Pinch in 3 weeks.  Contacts: For questions or concerns you should contact:  Dr. Jeral Pinch at [phone] After hours and on week-ends call [phone] and ask to speak to the physician on call for Gynecologic Oncology   After Your Surgery  The information in this section will tell you what to expect after your surgery,  both during your stay and after you leave. You will learn how to safely recover from your surgery. Write down any questions you have and be sure to ask your doctor or nurse.  What to Expect When you wake up after your surgery, you will be in the [facility] (PACU) or your recovery room. A nurse will be monitoring your body temperature, blood pressure, pulse, and oxygen levels. You may have a urinary catheter in your bladder to help monitor the amount of urine you are making. It should come out before you go home. You will also have compression boots on your lower legs to help your circulation. Your pain medication will be given through an IV line or in tablet form. If you are having pain, tell your nurse. Your nurse will tell you how to recover from your surgery. Below are examples of ways you can help yourself recover safely.  You will be encouraged to walk with the help of your nurse or physical therapist. We will give you medication to relieve pain. Walking helps reduce the risk for blood clots and pneumonia. It also helps to stimulate your bowels so they begin working again.  Use your incentive spirometer. This will help your lungs expand, which prevents pneumonia.   Commonly Asked Questions  Will I have pain after surgery? Yes, you will have some pain after your surgery, especially  in the first few days. Your doctor and nurse will ask you about your pain often. You will be given medication to manage your pain as needed. If your pain is not relieved, please tell your doctor or nurse. It is important to control your pain so you can cough, breathe deeply, use your incentive spirometer, and get out of bed and walk.  Will I be able to eat? Yes, you will be able to eat a regular diet or eat as tolerated. You should start with foods that are soft and easy to digest such as apple sauce and chicken noodle soup. Eat small meals frequently, and then advance to regular foods. If you  experience bloating, gas, or cramps, limit high-fiber foods, including whole grain breads and cereal, nuts, seeds, salads, fresh fruit, broccoli, cabbage, and cauliflower. Will I have pain when I am home? The length of time each person has pain or discomfort varies. You may still have some pain when you go home and will probably be taking pain medication. Follow the guidelines below.  Take your medications as directed and as needed.  Call your doctor if the medication prescribed for you doesn't relieve your pain.  Don't drive or drink alcohol while you're taking prescription pain medication.  As your incision heals, you will have less pain and need less pain medication. A mild pain reliever such as acetaminophen (Tylenol) or ibuprofen (Advil) will relieve aches and discomfort. However, large quantities of acetaminophen may be harmful to your liver. Don't take more acetaminophen than the amount directed on the bottle or as instructed by your doctor or nurse.  Pain medication should help you as you resume your normal activities. Take enough medication to do your exercises comfortably. Pain medication is most effective 30 to 45 minutes after taking it.  Keep track of when you take your pain medication. Taking it when your pain first begins is more effective than waiting for the pain to get worse. Pain medication may cause constipation (having fewer bowel movements than what is normal for you).  How can I prevent constipation?  Go to the bathroom at the same time every day. Your body will get used to going at that time.  If you feel the urge to go, don't put it off. Try to use the bathroom 5 to 15 minutes after meals.  After breakfast is a good time to move your bowels. The reflexes in your colon are strongest at this time.  Exercise, if you can. Walking is an excellent form of exercise.  Drink 8 (8-ounce) glasses (2 liters) of liquids daily, if you can. Drink water, juices, soups, ice cream  shakes, and other drinks that don't have caffeine. Drinks with caffeine, such as coffee and soda, pull fluid out of the body.  Slowly increase the fiber in your diet to 25 to 35 grams per day. Fruits, vegetables, whole grains, and cereals contain fiber. If you have an ostomy or have had recent bowel surgery, check with your doctor or nurse before making any changes in your diet.  Both over-the-counter and prescription medications are available to treat constipation. Start with 1 of the following over-the-counter medications first: o Docusate sodium (Colace) 100 mg. Take ___1__ capsules _2____ times a day. This is a stool softener that causes few side effects. Don't take it with mineral oil. o Polyethylene glycol (MiraLAX) 17 grams daily. o Senna (Senokot) 2 tablets at bedtime. This is a stimulant laxative, which can cause cramping.  If you haven't had  a bowel movement in 2 days, call your doctor or nurse.  Can I shower? Yes, you should shower 24 hours after your surgery. Be sure to shower every day. Taking a warm shower is relaxing and can help decrease muscle aches. Use soap when you shower and gently wash your incision. Pat the areas dry with a towel after showering, and leave your incision uncovered (unless there is drainage). Call your doctor if you see any redness or drainage from your incision. Don't take tub baths until you discuss it with your doctor at the first appointment after your surgery. How do I care for my incisions? You will have several small incisions on your abdomen. The incisions are closed with Steri-Strips or Dermabond. You may also have square white dressings on your incisions (Primapore). You can remove these in the shower 24 hours after your surgery. You should clean your incisions with soap and water. If you go home with Steri-Strips on your incision, they will loosen and may fall off by themselves. If they haven't fallen off within 10 days, you can remove them. If  you go home with Dermabond over your sutures (stitches), it will also loosen and peel off.  What are the most common symptoms after a hysterectomy? It's common for you to have some vaginal spotting or light bleeding. You should monitor this with a pad or a panty liner. If you have having heavy bleeding (bleeding through a pad or liner every 1 to 2 hours), call your doctor right away. It's also common to have some discomfort after surgery from the air that was pumped into your abdomen during surgery. To help with this, walk, drink plenty of liquids and make sure to take the stool softeners you received.  When is it safe for me to drive? You may resume driving 2 weeks after surgery, as long as you are not taking pain medication that may make you drowsy.  When can I resume sexual activity? Do not place anything in your vagina or have vaginal intercourse for 8 weeks after your surgery. Some people will need to wait longer than 8 weeks, so speak with your doctor before resuming sexual intercourse.  Will I be able to travel? Yes, you can travel. If you are traveling by plane within a few weeks after your surgery, make sure you get up and walk every hour. Be sure to stretch your legs, drink plenty of liquids, and keep your feet elevated when possible.  Will I need any supplies? Most people do not need any supplies after the surgery. In the rare case that you do need supplies, such as tubes or drains, your nurse will order them for you.  When can I return to work? The time it takes to return to work depends on the type of work you do, the type of surgery you had, and how fast your body heals. Most people can return to work about 2 to 4 weeks after the surgery.  What exercises can I do? Exercise will help you gain strength and feel better. Walking and stair climbing are excellent forms of exercise. Gradually increase the distance you walk. Climb stairs slowly, resting or stopping as needed. Ask your  doctor or nurse before starting more strenuous exercises.  When can I lift heavy objects? Most people should not lift anything heavier than 10 pounds (4.5 kilograms) for at least 4 weeks after surgery. Speak with your doctor about when you can do heavy lifting.  How can I cope  with my feelings? After surgery for a serious illness, you may have new and upsetting feelings. Many people say they felt weepy, sad, worried, nervous, irritable, and angry at one time or another. You may find that you can't control some of these feelings. If this happens, it's a good idea to seek emotional support. The first step in coping is to talk about how you feel. Family and friends can help. Your nurse, doctor, and social worker can reassure, support, and guide you. It's always a good idea to let these professionals know how you, your family, and your friends are feeling emotionally. Many resources are available to patients and their families. Whether you're in the hospital or at home, the nurses, doctors, and social workers are here to help you and your family and friends handle the emotional aspects of your illness.  When is my first appointment after surgery? Your first appointment after surgery will be 2 to 4 weeks after surgery. Your nurse will give you instructions on how to make this appointment, including the phone number to call.  What if I have other questions? If you have any questions or concerns, please talk with your doctor or nurse. You can reach them Monday through Friday from 9:00 am to 5:00 pm. After 5:00 pm, during the weekend, and on holidays, call [phone] and ask for the doctor on call for your doctor.   Have a temperature of 101 F (38.3 C) or higher  Have pain that does not get better with pain medication  Have redness, drainage, or swelling from your incisions

## 2019-11-13 NOTE — Anesthesia Postprocedure Evaluation (Signed)
Anesthesia Post Note  Patient: Shirley Pearson  Procedure(s) Performed: XI ROBOTIC ASSISTED HYSTERECTOMY, SALPINGECTOMY, SENTINEL NODE BIOPSY LYMPH NODE DISSECTION, MINI LAPAROTOMY (N/A )     Patient location during evaluation: PACU Anesthesia Type: General Level of consciousness: awake Pain management: pain level controlled Vital Signs Assessment: post-procedure vital signs reviewed and stable Respiratory status: spontaneous breathing, nonlabored ventilation, respiratory function stable and patient connected to nasal cannula oxygen Cardiovascular status: blood pressure returned to baseline and stable Postop Assessment: no apparent nausea or vomiting Anesthetic complications: no   No complications documented.  Last Vitals:  Vitals:   11/13/19 1600 11/13/19 1643  BP: (!) 139/91 (!) 141/87  Pulse: 82 92  Resp: 12 12  Temp:    SpO2: 93% 95%    Last Pain:  Vitals:   11/13/19 1643  TempSrc:   PainSc: 5                  Jemuel Laursen P Pawan Knechtel

## 2019-11-13 NOTE — Transfer of Care (Signed)
Immediate Anesthesia Transfer of Care Note  Patient: Shirley Pearson  Procedure(s) Performed: XI ROBOTIC ASSISTED HYSTERECTOMY, SALPINGECTOMY, SENTINEL NODE BIOPSY LYMPH NODE DISSECTION, MINI LAPAROTOMY (N/A )  Patient Location: PACU  Anesthesia Type:General  Level of Consciousness: awake, alert  and oriented  Airway & Oxygen Therapy: Patient Spontanous Breathing and Patient connected to face mask oxygen  Post-op Assessment: Report given to RN and Post -op Vital signs reviewed and stable  Post vital signs: Reviewed and stable  Last Vitals:  Vitals Value Taken Time  BP    Temp    Pulse 86 11/13/19 1349  Resp 13 11/13/19 1349  SpO2 100 % 11/13/19 1349  Vitals shown include unvalidated device data.  Last Pain:  Vitals:   11/13/19 0901  TempSrc:   PainSc: 7       Patients Stated Pain Goal: 6 (48/18/59 0931)  Complications: No complications documented.

## 2019-11-13 NOTE — Op Note (Signed)
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   Anesthesia: GET  Urine Output: 250 cc  Operative Findings: On EUA, 16cm mobile uterus. On intra-abdominal entry, normal upper abdominal survey. Normal small and large bowel, omentum. Uterus bulbous and 16-18cm. No obvious serosal defects or evidence of full thickness tumor involvement. Normal appearing bilateral adnexa. Mapping successful to right and left obturator SLN, right presacral SLN. Tissue edematous, especially the plane between bladder and cervix on the left. NO intra-abdominal or pelvic evidence of disease.  Estimated Blood Loss:  less than 100 mL      Total IV Fluids: see I&O flowsheet         Specimens: uterus, cervix, bilateral tubes, right and left obturator SLNs, right pre-sacral SLN         Complications:  None apparent; patient tolerated the procedure well.         Disposition: PACU - hemodynamically stable.  Procedure Details  The patient was seen in the Holding Room. The risks, benefits, complications, treatment options, and expected outcomes were discussed with the patient.  The patient concurred with the proposed plan, giving informed consent.  The site of surgery properly noted/marked. The patient was identified as Shirley Pearson and the procedure verified as a Robotic-assisted hysterectomy with bilateral salpingectomy with SLN biopsy.   After induction of anesthesia, the patient was draped and prepped in the usual sterile manner. Patient was placed in supine  position after anesthesia and draped and prepped in the usual sterile manner as follows: Her arms were tucked to her side with all appropriate precautions.  The shoulders were stabilized with padded shoulder blocks applied to the acromium processes.  The patient was placed in the semi-lithotomy position in [place].  The perineum and vagina were prepped with CholoraPrep. The patient was draped after the CholoraPrep had been allowed to dry for 3 minutes.  A Time Out was held and the above information confirmed.  The urethra was prepped with Betadine. Foley catheter was placed.  A sterile speculum was placed in the vagina.  The cervix was grasped with a single-tooth tenaculum. 2mg  total of ICG was injected into the cervical stroma at 2 and 9 o'clock with 1cc injected at a 1cm and 80mm depth (concentration 0.5mg /ml) in all locations. The cervix was dilated with Kennon Rounds dilators.  The ZUMI uterine manipulator with a medium colpotomizer ring was placed without difficulty.  A pneum occluder balloon was placed over the manipulator.  OG tube placement was confirmed and to suction.   Next, a 10 mm skin incision was made 1 cm below the subcostal margin in the midclavicular line.  The 5 mm Optiview port and scope was used for direct entry.  Opening pressure was under 10 mm CO2.  The abdomen was insufflated and the findings were noted as above.   At this point and all points during the procedure, the patient's intra-abdominal pressure did not exceed 15 mmHg. Next, an 8 mm skin incision was made superior to the umbilicus and a right and left port were placed about 8 cm lateral to the robot port on the right and left side.  A fourth arm was placed on the right.  The 5 mm assist trocar was exchanged for a 10-12 mm port. All ports were placed under direct visualization.  The patient was placed in steep Trendelenburg.  Bowel was folded away into the upper abdomen.  The robot was docked in the normal manner.  The right and  left peritoneum were opened parallel to the IP ligament to open the retroperitoneal spaces bilaterally. The round ligaments were transected. The SLN mapping was performed in bilateral pelvic basins. After identifying the ureters, the para rectal and paravesical spaces were opened up entirely with careful dissection below the level of the ureters bilaterally and to the depth of the uterine artery origin in order to skeletonize the uterine "web" and ensure visualization of all parametrial channels. The para-aortic basins were carefully exposed and evaluated for isolated para-aortic SLN's. Lymphatic channels were identified travelling to the following visualized sentinel lymph node's: bilateral obturator SLNs and a right pre-sacral SLN. These SLN's were separated from their surrounding lymphatic tissue, removed and sent for permanent pathology.  The hysterectomy was started.  The ureter was again noted to be on the medial leaf of the broad ligament.  The peritoneum above the ureter was incised and stretched. The fallopian tube was elevated and a combination of monopolar and bipolar electrocautery was used to cauterize and transect between the tube and ovary along the mesosalpinx. The fallopian tube and utero-ovarian ligament were cauterized and transected.  The posterior peritoneum was taken down to the level of the KOH ring.  The anterior peritoneum was also taken down.  The bladder flap was created to the level of the KOH ring.  The uterine artery on the right side was skeletonized, cauterized and cut in the normal manner.  A similar procedure was performed on the left.  The colpotomy was made and the uterus, cervix, bilateral ovaries and tubes were amputated and placed in a 41mm spleen bag inserted through the assist trocar.  Pedicles were inspected and excellent hemostasis was achieved.    The colpotomy at the vaginal cuff was closed with Vicryl on a CT1 needle in running manner.  Irrigation was used and  excellent hemostasis was achieved.  At this point in the robotic procedure was completed.  Robotic instruments were removed under direct visulaization.  The robot was undocked.   With the abdomen still insufflated, the supraumbilical incision was extended to a length of 10cm down to the fascia. The fascial defect and peritoneal defects were extended to allow delivery of the bagged specimen through the incision. The fascia was then closed with two looped 0 PDS tied at the midline. The subcutaneous tissue was irrigated, hemostasis achieved, and 0.25% marcaine injected. The subcutaneous tissue was reapproximated with 2-0 Vicryl. The fascia at the 10-12 mm port was closed with 0 Vicryl on a UR-5 needle.  The subcuticular tissue of all incisions was closed with 4-0 Vicryl and the skin was closed with 4-0 Monocryl in a subcuticular manner.  Dermabond was applied.    The vagina was swabbed with  minimal bleeding noted.   All sponge, lap and needle counts were correct x  3. Foley catheter was removed.  The patient was transferred to the recovery room in stable condition.  Jeral Pinch, MD

## 2019-11-13 NOTE — Interval H&P Note (Signed)
History and Physical Interval Note:  11/13/2019 9:08 AM  Shirley Pearson  has presented today for surgery, with the diagnosis of ENDOMETRIAL CANCER.  The various methods of treatment have been discussed with the patient and family. After consideration of risks, benefits and other options for treatment, the patient has consented to  Procedure(s): XI ROBOTIC ASSISTED TOTAL HYSTERECTOMY, SALPINGECTOMY, SENTINEL NODE BIOPSY, POSSIBLE LYMPH NODE DISSECTION, POSSIBLE BILATERAL OOPHORECTOMY, POSSIBLE LAPAROTOMY (N/A) as a surgical intervention.  The patient's history has been reviewed, patient examined, no change in status, stable for surgery.  I have reviewed the patient's chart and labs.  Questions were answered to the patient's satisfaction.     Lafonda Mosses

## 2019-11-13 NOTE — Anesthesia Procedure Notes (Signed)
Procedure Name: Intubation Date/Time: 11/13/2019 10:14 AM Performed by: Maxwell Caul, CRNA Pre-anesthesia Checklist: Patient identified, Emergency Drugs available, Suction available and Patient being monitored Patient Re-evaluated:Patient Re-evaluated prior to induction Oxygen Delivery Method: Circle system utilized Preoxygenation: Pre-oxygenation with 100% oxygen Induction Type: IV induction Ventilation: Mask ventilation without difficulty Laryngoscope Size: Mac and 4 Grade View: Grade I Tube type: Oral Tube size: 7.5 mm Airway Equipment and Method: Stylet and Oral airway Placement Confirmation: ETT inserted through vocal cords under direct vision,  positive ETCO2 and breath sounds checked- equal and bilateral Secured at: 22 cm Tube secured with: Tape Dental Injury: Teeth and Oropharynx as per pre-operative assessment  Comments: EMT Wells Guiles attempted intubation, DL X 1 with MAC 4, unsuccessful. DLx1 via CRNA, MAC4 grade 1 view.

## 2019-11-14 ENCOUNTER — Encounter (HOSPITAL_COMMUNITY): Payer: Self-pay | Admitting: Gynecologic Oncology

## 2019-11-14 ENCOUNTER — Telehealth: Payer: Self-pay | Admitting: *Deleted

## 2019-11-14 NOTE — Telephone Encounter (Signed)
I spoke with Shirley Pearson post op day 1. She reports that she is up and moving this morning. She states pain is 6/10 w/ movement. She is taking ibuprofen and oxycodone as ordered for pain. She is eating and drinking without difficulty this morning. She is voiding, but reports that she has not passed gas or had a BM yet. She took Senna last night and plans to take another dose this evening. She was instructed that she can add miralax if needed for constipation. I encouraged her to increase her water intake. She denies fever, chills or drainage from incisions. She is aware of the times/dates of her upcoming appointments. She has the office phone number and will call with any questions or concerns

## 2019-11-16 LAB — SURGICAL PATHOLOGY

## 2019-11-20 ENCOUNTER — Encounter: Payer: Self-pay | Admitting: Gynecologic Oncology

## 2019-11-20 ENCOUNTER — Inpatient Hospital Stay: Payer: Self-pay | Attending: Gynecologic Oncology | Admitting: Gynecologic Oncology

## 2019-11-20 DIAGNOSIS — Z90710 Acquired absence of both cervix and uterus: Secondary | ICD-10-CM | POA: Insufficient documentation

## 2019-11-20 DIAGNOSIS — C541 Malignant neoplasm of endometrium: Secondary | ICD-10-CM

## 2019-11-20 DIAGNOSIS — Z90722 Acquired absence of ovaries, bilateral: Secondary | ICD-10-CM | POA: Insufficient documentation

## 2019-11-20 NOTE — Progress Notes (Signed)
Gynecologic Oncology Telehealth Consult Note: Gyn-Onc  I connected with Shirley Pearson on 11/20/19 at  3:30 PM EDT by telephone and verified that I am speaking with the correct person using two identifiers.  I discussed the limitations, risks, security and privacy concerns of performing an evaluation and management service by telemedicine and the availability of in-person appointments. I also discussed with the patient that there may be a patient responsible charge related to this service. The patient expressed understanding and agreed to proceed.  Other persons participating in the visit and their role in the encounter: none.  Patient's location: home Provider's location: [facility]  Reason for Visit: follow-up and review of pathology, treatment planning  Treatment History: Oncology History Overview Note  IHC MMR intact   Endometrial cancer ([place])  10/08/2019 Initial Biopsy   EMB - FIGO gr1 endometrioid adenoca  MMR IHC intact   10/10/2019 Initial Diagnosis   Endometrial cancer ([place])   10/22/2019 Surgery   D&C and Mirena IUD insertion  Path - FIGO gr 1   10/31/2019 Imaging   MRI pelvis: Uterus: Measures 14.8 x 8 5 by 12.2 cm (volume = 300 cm^3). A focal adenomyoma is seen in the left posterior uterine corpus measuring 4.7 cm. A large ill-defined heterogeneously enhancing endometrial mass is seen in the uterine body and corpus, consistent with known endometrial carcinoma. This shows deep myometrial invasion of greater than 50% in the right uterine fundus and corpus. No evidence extra uterine extension tumor. An IUD is visualized in inferior portion of the endometrial cavity in the lower uterine segment. Cervix and vagina are unremarkable.   11/13/2019 Surgery   Robotic-assisted laparoscopic total hysterectomy with bilateral salpingectomy, SLN biopsy, mini-lap for specimen removal    11/13/2019 Pathology Results   IA, grade 1 EMCA, <20% MI, no LVSI, neg SLNs     Interval  History: Patient is overall doing well since surgery.  She reports some pain still when she ambulates but notes this is improving daily.  She reports regular bowel and bladder function.  She is tolerating p.o. without nausea or emesis.  She had what she thought may have been some fevers at night the first couple nights after surgery but reports this has resolved.  She never took her temperature.  She denies any vaginal bleeding or discharge.  Past Medical/Surgical History: Past Medical History:  Diagnosis Date  . Abnormal uterine bleeding   . Anemia   . Complex atypical endometrial hyperplasia   . Obesity (BMI 30-39.9)     Past Surgical History:  Procedure Laterality Date  . DILATION AND CURETTAGE OF UTERUS  yrs ago  . DILATION AND CURETTAGE OF UTERUS N/A 10/22/2019   Procedure: DILATATION AND CURETTAGE;  Surgeon: Lafonda Mosses, MD;  Location: [facility];  Service: Gynecology;  Laterality: N/A;  . INTRAUTERINE DEVICE (IUD) INSERTION N/A 10/22/2019   Procedure: INTRAUTERINE DEVICE (IUD) INSERTION;  Surgeon: Lafonda Mosses, MD;  Location: [facility];  Service: Gynecology;  Laterality: N/A;  . OPERATIVE ULTRASOUND N/A 10/22/2019   Procedure: OPERATIVE ULTRASOUND;  Surgeon: Lafonda Mosses, MD;  Location: [facility];  Service: Gynecology;  Laterality: N/A;  . ROBOTIC ASSISTED TOTAL HYSTERECTOMY N/A 11/13/2019   Procedure: XI ROBOTIC ASSISTED HYSTERECTOMY, SALPINGECTOMY, SENTINEL NODE BIOPSY LYMPH NODE DISSECTION, MINI LAPAROTOMY;  Surgeon: Lafonda Mosses, MD;  Location: [facility];  Service: Gynecology;  Laterality: N/A;    Family History  Problem Relation Age of Onset  . Diabetes Mother   .  Colon cancer Neg Hx   . Breast cancer Neg Hx   . Ovarian cancer Neg Hx   . Uterine cancer Neg Hx     Social History   Socioeconomic History  . Marital status: Single    Spouse name: Not on file  . Number of children: Not on file  . Years  of education: Not on file  . Highest education level: Not on file  Occupational History  . Occupation: Press photographer at [employer]  Tobacco Use  . Smoking status: Never Smoker  . Smokeless tobacco: Never Used  Vaping Use  . Vaping Use: Never used  Substance and Sexual Activity  . Alcohol use: Not Currently  . Drug use: Never  . Sexual activity: Not Currently    Birth control/protection: Abstinence  Other Topics Concern  . Not on file  Social History Narrative  . Not on file   Social Determinants of Health   Financial Resource Strain:   . Difficulty of Paying Living Expenses:   Food Insecurity:   . Worried About Charity fundraiser in the Last Year:   . Arboriculturist in the Last Year:   Transportation Needs:   . Film/video editor (Medical):   Marland Kitchen Lack of Transportation (Non-Medical):   Physical Activity:   . Days of Exercise per Week:   . Minutes of Exercise per Session:   Stress:   . Feeling of Stress :   Social Connections:   . Frequency of Communication with Friends and Family:   . Frequency of Social Gatherings with Friends and Family:   . Attends Religious Services:   . Active Member of Clubs or Organizations:   . Attends Archivist Meetings:   Marland Kitchen Marital Status:     Current Medications:  Current Outpatient Medications:  .  ibuprofen (ADVIL) 800 MG tablet, Take 1 tablet (800 mg total) by mouth every 8 (eight) hours as needed for moderate pain. For AFTER surgery, Disp: 30 tablet, Rfl: 0 .  oxyCODONE (OXY IR/ROXICODONE) 5 MG immediate release tablet, Take 1 tablet (5 mg total) by mouth every 4 (four) hours as needed for severe pain. For AFTER surgery, do not take and drive, Disp: 15 tablet, Rfl: 0 .  senna (SENOKOT) 8.6 MG TABS tablet, Take 1 tablet (8.6 mg total) by mouth daily as needed for up to 120 doses for mild constipation., Disp: 30 tablet, Rfl: 3  Review of Symptoms: Pertinent positives as per HPI, otherwise review of systems  negative.  Physical Exam: LMP 11/05/2019  Deferred given limitations of phone visit.  Laboratory & Radiologic Studies: FINAL MICROSCOPIC DIAGNOSIS:   A. LYMPH NODE, SENTINEL, RIGHT OBTURATOR, BIOPSY:  - One of one lymph nodes negative for carcinoma (0/1).   B. LYMPH NODE, SENTINEL, LEFT OBTURATOR, BIOPSY:  - Benign fibroadipose tissue.   C. LYMPH NODE, SENTINEL, RIGHT PRESACRAL, BIOPSY:  - One of one lymph nodes negative for carcinoma (0/1).   D. UTERUS, CERVIX, BILATERAL TUBES, HYSTERECTOMY AND SALPINGECTOMY:  - Uterus:    Endomyometrium: Endometrioid adenocarcinoma, FIGO grade 1, see  comment.       Tumor invades less than one half of the myometrium, see  comment.       Extensive hyperplasia involving adenomyosis.       See oncology table.    Serosa: Unremarkable. No malignancy.  - Cervix: Benign squamous and endocervical mucosa. No dysplasia or  malignancy.  - Bilateral fallopian tubes: Unremarkable. No malignancy.   ONCOLOGY TABLE:  UTERUS, CARCINOMA OR CARCINOSARCOMA   Procedure: Total hysterectomy and bilateral salpingectomy.  Histologic type: Endometrioid adenocarcinoma  Histologic Grade: FIGO grade I  Myometrial invasion:    Depth of invasion: 4 mm    Myometrial thickness: 25 mm  Uterine Serosa Involvement: Not identified  Cervical stromal involvement: Not identified  Extent of involvement of other organs: Not identified  Lymphovascular invasion: Not identified  Regional Lymph Nodes:    Examined:   2 Sentinel                0 non-sentinel                2 total     Lymph nodes with metastasis: 0     Isolated tumor cells (<0.2 mm): 0     Micrometastasis: (>0.2 mm and < 2.0 mm): 0     Macrometastasis: (>2.0 mm): 0     Extracapsular extension: Not applicable  Representative Tumor Block: D11  MMR / MSI testing: Performed on the biopsy, MMR- normal. Can be repeated  upon request.   Pathologic Stage Classification (pTNM, AJCC 8th edition): pT1a, pN0  Comments: There is extensive endometrial hyperplasia which extends to  involve adenomyosis. The tumor is patchy and thus size cannot be  accurately evaluated. Tumor is also seen focally involving adenomyosis,  but true invasion is limited to the inner half of the myometrium.  Pancytokeratin performed on the lymph nodes is negative.  Assessment & Plan: Shirley Pearson is a 39 y.o. 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  Patient is overall doing well and meeting postoperative milestones.  We discussed her final pathology.  She was very happy with this news.  We discussed that given her low risk of recurrence, no adjuvant treatment is recommended.  She understands that she will sees me on a more regular basis for surveillance visits.  All questions answered.  I discussed the assessment and treatment plan with the patient. The patient was provided with an opportunity to ask questions and all were answered. The patient agreed with the plan and demonstrated an understanding of the instructions.   The patient was advised to call back or see an in-person evaluation if the symptoms worsen or if the condition fails to improve as anticipated.   14 minutes of total time was spent for this patient encounter, including preparation, face-to-face counseling with the patient and coordination of care, and documentation of the encounter.   Jeral Pinch, MD  Division of Gynecologic Oncology  Department of Obstetrics and Gynecology  [facility]

## 2019-11-27 ENCOUNTER — Telehealth: Payer: Self-pay | Admitting: Oncology

## 2019-11-27 NOTE — Telephone Encounter (Signed)
Shirley Pearson called back and is interested in genetic counseling but does not have insurance.  She is wondering what the cost would be.  Advised her that I will find out and call her back.

## 2019-11-27 NOTE — Telephone Encounter (Signed)
Left a message regarding genetic counseling.  Requested a return call.

## 2019-12-04 NOTE — Telephone Encounter (Signed)
Called Shirley Pearson and advised her that it will be around $120.00 for the genetic counseling appointment.  There is a program that she can apply for to cover the cost of the actual testing.  She said she would like to think about it and will let us know at her appointment on 12/07/19 with Dr. Berline Lopes

## 2019-12-07 ENCOUNTER — Inpatient Hospital Stay: Payer: Self-pay

## 2019-12-07 ENCOUNTER — Encounter: Payer: Self-pay | Admitting: Gynecologic Oncology

## 2019-12-07 ENCOUNTER — Other Ambulatory Visit: Payer: Self-pay

## 2019-12-07 ENCOUNTER — Inpatient Hospital Stay (HOSPITAL_BASED_OUTPATIENT_CLINIC_OR_DEPARTMENT_OTHER): Payer: Self-pay | Admitting: Gynecologic Oncology

## 2019-12-07 ENCOUNTER — Telehealth: Payer: Self-pay | Admitting: Gynecologic Oncology

## 2019-12-07 VITALS — BP 135/84 | HR 79 | Temp 99.1°F | Resp 17 | Ht 60.0 in | Wt 160.8 lb

## 2019-12-07 DIAGNOSIS — C541 Malignant neoplasm of endometrium: Secondary | ICD-10-CM

## 2019-12-07 DIAGNOSIS — Z90710 Acquired absence of both cervix and uterus: Secondary | ICD-10-CM

## 2019-12-07 DIAGNOSIS — D500 Iron deficiency anemia secondary to blood loss (chronic): Secondary | ICD-10-CM

## 2019-12-07 DIAGNOSIS — Z90722 Acquired absence of ovaries, bilateral: Secondary | ICD-10-CM

## 2019-12-07 LAB — CBC (CANCER CENTER ONLY)
HCT: 33.1 % — ABNORMAL LOW (ref 36.0–46.0)
Hemoglobin: 10.5 g/dL — ABNORMAL LOW (ref 12.0–15.0)
MCH: 24.5 pg — ABNORMAL LOW (ref 26.0–34.0)
MCHC: 31.7 g/dL (ref 30.0–36.0)
MCV: 77.2 fL — ABNORMAL LOW (ref 80.0–100.0)
Platelet Count: 556 10*3/uL — ABNORMAL HIGH (ref 150–400)
RBC: 4.29 MIL/uL (ref 3.87–5.11)
RDW: 15.7 % — ABNORMAL HIGH (ref 11.5–15.5)
WBC Count: 7.9 10*3/uL (ref 4.0–10.5)
nRBC: 0 % (ref 0.0–0.2)

## 2019-12-07 MED ORDER — DOCUSATE SODIUM 100 MG PO CAPS: 100.0000 mg | ORAL_CAPSULE | Freq: Every day | ORAL | 3 refills | Status: DC | PRN

## 2019-12-07 MED ORDER — FERROUS SULFATE 325 (65 FE) MG PO TBEC: 325.0000 mg | DELAYED_RELEASE_TABLET | Freq: Every day | ORAL | 3 refills | Status: DC

## 2019-12-07 NOTE — Patient Instructions (Signed)
You are healing well from surgery!  I will call you with your CBC results from today.  We will plan to have a phone visit the week before you are scheduled to go back to work.  Depending on your symptoms at that time, we will get additional blood work if needed.  Otherwise, I will plan to see you every 6 months for a visit and exam.  If between visits, you develop any vaginal bleeding, pain, or other symptoms, please call the clinic to be seen sooner.

## 2019-12-07 NOTE — Progress Notes (Signed)
Gynecologic Oncology Return Clinic Visit  12/07/2019  Reason for Visit: Follow-up after surgery in the setting of low risk uterine cancer  Treatment History: Oncology History Overview Note  IHC MMR intact   Endometrial cancer ([place])  10/08/2019 Initial Biopsy   EMB - FIGO gr1 endometrioid adenoca  MMR IHC intact   10/10/2019 Initial Diagnosis   Endometrial cancer ([place])   10/22/2019 Surgery   D&C and Mirena IUD insertion  Path - FIGO gr 1   10/31/2019 Imaging   MRI pelvis: Uterus: Measures 14.8 x 8 5 by 12.2 cm (volume = 300 cm^3). A focal adenomyoma is seen in the left posterior uterine corpus measuring 4.7 cm. A large ill-defined heterogeneously enhancing endometrial mass is seen in the uterine body and corpus, consistent with known endometrial carcinoma. This shows deep myometrial invasion of greater than 50% in the right uterine fundus and corpus. No evidence extra uterine extension tumor. An IUD is visualized in inferior portion of the endometrial cavity in the lower uterine segment. Cervix and vagina are unremarkable.   11/13/2019 Surgery   Robotic-assisted laparoscopic total hysterectomy with bilateral salpingectomy, SLN biopsy, mini-lap for specimen removal    11/13/2019 Pathology Results   IA, grade 1 EMCA, <20% MI, no LVSI, neg SLNs     Interval History: Patient reports that overall since surgery, she has been quite tired and feels like she has been going through a lot of emotional changes.  She also endorses intermittent dizziness with position changes or when she is standing.  She endorses having a good appetite and feels like she is eating a lot as well as staying well-hydrated.  She denies any nausea or emesis.  She reports intermittent mild abdominal pain for which she is using Tylenol as needed.  She endorses normal bowel function and denies any urinary symptoms.  She denies any vaginal bleeding or discharge since surgery.  She denies any fevers or chills.  Past  Medical/Surgical History: Past Medical History:  Diagnosis Date  . Abnormal uterine bleeding   . Anemia   . Complex atypical endometrial hyperplasia   . Obesity (BMI 30-39.9)     Past Surgical History:  Procedure Laterality Date  . DILATION AND CURETTAGE OF UTERUS  yrs ago  . DILATION AND CURETTAGE OF UTERUS N/A 10/22/2019   Procedure: DILATATION AND CURETTAGE;  Surgeon: Lafonda Mosses, MD;  Location: [facility];  Service: Gynecology;  Laterality: N/A;  . INTRAUTERINE DEVICE (IUD) INSERTION N/A 10/22/2019   Procedure: INTRAUTERINE DEVICE (IUD) INSERTION;  Surgeon: Lafonda Mosses, MD;  Location: [facility];  Service: Gynecology;  Laterality: N/A;  . OPERATIVE ULTRASOUND N/A 10/22/2019   Procedure: OPERATIVE ULTRASOUND;  Surgeon: Lafonda Mosses, MD;  Location: [facility];  Service: Gynecology;  Laterality: N/A;  . ROBOTIC ASSISTED TOTAL HYSTERECTOMY N/A 11/13/2019   Procedure: XI ROBOTIC ASSISTED HYSTERECTOMY, SALPINGECTOMY, SENTINEL NODE BIOPSY LYMPH NODE DISSECTION, MINI LAPAROTOMY;  Surgeon: Lafonda Mosses, MD;  Location: [facility];  Service: Gynecology;  Laterality: N/A;    Family History  Problem Relation Age of Onset  . Diabetes Mother   . Colon cancer Neg Hx   . Breast cancer Neg Hx   . Ovarian cancer Neg Hx   . Uterine cancer Neg Hx     Social History   Socioeconomic History  . Marital status: Single    Spouse name: Not on file  . Number of children: Not on file  . Years of education: Not on  file  . Highest education level: Not on file  Occupational History  . Occupation: Press photographer at [employer]  Tobacco Use  . Smoking status: Never Smoker  . Smokeless tobacco: Never Used  Vaping Use  . Vaping Use: Never used  Substance and Sexual Activity  . Alcohol use: Not Currently  . Drug use: Never  . Sexual activity: Not Currently    Birth control/protection: Abstinence  Other Topics Concern  . Not on file   Social History Narrative  . Not on file   Social Determinants of Health   Financial Resource Strain:   . Difficulty of Paying Living Expenses:   Food Insecurity:   . Worried About Charity fundraiser in the Last Year:   . Arboriculturist in the Last Year:   Transportation Needs:   . Film/video editor (Medical):   Marland Kitchen Lack of Transportation (Non-Medical):   Physical Activity:   . Days of Exercise per Week:   . Minutes of Exercise per Session:   Stress:   . Feeling of Stress :   Social Connections:   . Frequency of Communication with Friends and Family:   . Frequency of Social Gatherings with Friends and Family:   . Attends Religious Services:   . Active Member of Clubs or Organizations:   . Attends Archivist Meetings:   Marland Kitchen Marital Status:     Current Medications:  Current Outpatient Medications:  .  senna (SENOKOT) 8.6 MG TABS tablet, Take 1 tablet (8.6 mg total) by mouth daily as needed for up to 120 doses for mild constipation., Disp: 30 tablet, Rfl: 3 .  ibuprofen (ADVIL) 800 MG tablet, Take 1 tablet (800 mg total) by mouth every 8 (eight) hours as needed for moderate pain. For AFTER surgery (Patient not taking: Reported on 12/07/2019), Disp: 30 tablet, Rfl: 0 .  oxyCODONE (OXY IR/ROXICODONE) 5 MG immediate release tablet, Take 1 tablet (5 mg total) by mouth every 4 (four) hours as needed for severe pain. For AFTER surgery, do not take and drive (Patient not taking: Reported on 12/07/2019), Disp: 15 tablet, Rfl: 0  Review of Systems: Pertinent positives as per HPI. Denies fevers, chills, fatigue, unexplained weight changes. Denies hearing loss, neck lumps or masses, mouth sores, ringing in ears or voice changes. Denies cough or wheezing.  Denies shortness of breath. Denies chest pain or palpitations. Denies leg swelling. Denies abdominal distention, blood in stools, constipation, diarrhea, nausea, vomiting, or early satiety. Denies pain with intercourse,  dysuria, frequency, hematuria or incontinence. Denies hot flashes, pelvic pain, vaginal bleeding or vaginal discharge.   Denies joint pain, back pain or muscle pain/cramps. Denies itching, rash, or wounds. Denies headaches, numbness or seizures. Denies swollen lymph nodes or glands, denies easy bruising or bleeding. Denies anxiety, depression, confusion, or decreased concentration.  Physical Exam: BP (!) 135/84 (BP Location: Left Arm, Patient Position: Sitting)   Pulse 79   Temp 99.1 F (37.3 C) (Temporal)   Resp 17   Ht 5' (1.524 m)   Wt 160 lb 12.8 oz (72.9 kg)   SpO2 100%   BMI 31.40 kg/m  General: Alert, oriented, no acute distress. HEENT: Atraumatic, normocephalic, sclera anicteric.  Mild conjunctival pallor. Chest: Unlabored breathing on room air. Abdomen: Obese, soft, nontender.  Normoactive bowel sounds.  No masses or hepatosplenomegaly appreciated.  Well-healing incisions.  Honeycomb dressing as well as Dermabond removed. Extremities: Grossly normal range of motion.  Warm, well perfused.  No edema bilaterally. Skin:  No rashes or lesions noted. GU: Normal appearing external genitalia without erythema, excoriation, or lesions.  Speculum exam reveals cuff intact, some suture still visible, no bleeding.  Physiologic discharge.  Bimanual exam reveals cuff intact, no tenderness or fluctuance.    Laboratory & Radiologic Studies: None new  Assessment & Plan: Luvern Mischke is a 39 y.o. 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  Patient is overall meeting postoperative milestones.  She voices some concern about going back to work at 6 weeks postop.  This seems to be related to her fatigue, emotional changes and dizziness.  She had some anemia going into surgery, which I suspect has improved.  I will plan to get a CBC today.  We discussed that fatigue is normal in the postoperative setting and I am hopeful that her symptoms will  improve over the next couple of weeks.  While her ovaries were normal in appearance and the ovarian vasculature was not compromised during surgery to the best of my knowledge, we discussed the unlikely scenario that there was disruption to bilateral ovaries that has caused some loss of ovarian function.  If her symptoms persist or she develops other symptoms that are suggestive of loss of ovarian function, we will plan to get hormone testing at her next visit.  We will schedule a phone visit the week before she is scheduled to go back to work.  In the setting of low risk early stage endometrial cancer, we discussed SGO surveillance recommendations of follow-up visit every 6 months for the first year and then transitioning to yearly visits.  I reviewed the signs and symptoms that would be concerning for disease recurrence and that should prompt a phone call prior to her next scheduled visit.  The patient and I spoke again about the role of excess estrogen in the pathogenesis of endometrial adenocarcinoma.  She voices understanding of the importance of weight loss both in terms of her overall health but also her cancer-related martality.  20 minutes of total time was spent for this patient encounter, including preparation, face-to-face counseling with the patient and coordination of care, and documentation of the encounter.  Jeral Pinch, MD  Division of Gynecologic Oncology  Department of Obstetrics and Gynecology  [facility]

## 2019-12-07 NOTE — Telephone Encounter (Signed)
Called patient with labs from today.  Continued microcytic anemia likely consistent with iron deficiency anemia.  I would have expected her hemoglobin to have recovered a little bit more after surgery than it has in the last month.  I will plan to send a prescription for iron as well as a stool softener to the patient's pharmacy.  Jeral Pinch MD Gynecologic Oncology

## 2019-12-18 NOTE — Progress Notes (Signed)
Gynecologic Oncology Telehealth Consult Note: Gyn-Onc  I connected with Olen Pel on 12/18/19 at  2:00 PM EDT by telephone and verified that I am speaking with the correct person using two identifiers.  I discussed the limitations, risks, security and privacy concerns of performing an evaluation and management service by telemedicine and the availability of in-person appointments. I also discussed with the patient that there may be a patient responsible charge related to this service. The patient expressed understanding and agreed to proceed.  Other persons participating in the visit and their role in the encounter: None.  Patient's location: Home Provider's location: [facility]  Reason for Visit: Follow-up regarding postoperative symptoms  Treatment History: Oncology History Overview Note  IHC MMR intact   Endometrial cancer ([place])  10/08/2019 Initial Biopsy   EMB - FIGO gr1 endometrioid adenoca  MMR IHC intact   10/10/2019 Initial Diagnosis   Endometrial cancer ([place])   10/22/2019 Surgery   D&C and Mirena IUD insertion  Path - FIGO gr 1   10/31/2019 Imaging   MRI pelvis: Uterus: Measures 14.8 x 8 5 by 12.2 cm (volume = 300 cm^3). A focal adenomyoma is seen in the left posterior uterine corpus measuring 4.7 cm. A large ill-defined heterogeneously enhancing endometrial mass is seen in the uterine body and corpus, consistent with known endometrial carcinoma. This shows deep myometrial invasion of greater than 50% in the right uterine fundus and corpus. No evidence extra uterine extension tumor. An IUD is visualized in inferior portion of the endometrial cavity in the lower uterine segment. Cervix and vagina are unremarkable.   11/13/2019 Surgery   Robotic-assisted laparoscopic total hysterectomy with bilateral salpingectomy, SLN biopsy, mini-lap for specimen removal    11/13/2019 Pathology Results   IA, grade 1 EMCA, <20% MI, no LVSI, neg SLNs      Interval History: Patient reports that overall she is doing much better than when she saw me in clinic.  Her energy has improved and overall she feels much less emotional.  She denies any recent episodes of dizziness.  She notes intermittent pruritus associated with her smaller incisions.  She denies erythema and less she began scratching around the incisions.  She denies any drainage or other skin changes.  She denies any vaginal bleeding or discharge.  Past Medical/Surgical History: Past Medical History:  Diagnosis Date  . Abnormal uterine bleeding   . Anemia   . Complex atypical endometrial hyperplasia   . Obesity (BMI 30-39.9)     Past Surgical History:  Procedure Laterality Date  . DILATION AND CURETTAGE OF UTERUS  yrs ago  . DILATION AND CURETTAGE OF UTERUS N/A 10/22/2019   Procedure: DILATATION AND CURETTAGE;  Surgeon: Lafonda Mosses, MD;  Location: [facility];  Service: Gynecology;  Laterality: N/A;  . INTRAUTERINE DEVICE (IUD) INSERTION N/A 10/22/2019   Procedure: INTRAUTERINE DEVICE (IUD) INSERTION;  Surgeon: Lafonda Mosses, MD;  Location: [facility];  Service: Gynecology;  Laterality: N/A;  . OPERATIVE ULTRASOUND N/A 10/22/2019   Procedure: OPERATIVE ULTRASOUND;  Surgeon: Lafonda Mosses, MD;  Location: [facility];  Service: Gynecology;  Laterality: N/A;  . ROBOTIC ASSISTED TOTAL HYSTERECTOMY N/A 11/13/2019   Procedure: XI ROBOTIC ASSISTED HYSTERECTOMY, SALPINGECTOMY, SENTINEL NODE BIOPSY LYMPH NODE DISSECTION, MINI LAPAROTOMY;  Surgeon: Lafonda Mosses, MD;  Location: [facility];  Service: Gynecology;  Laterality: N/A;    Family History  Problem Relation Age of Onset  . Diabetes Mother   . Colon  cancer Neg Hx   . Breast cancer Neg Hx   . Ovarian cancer Neg Hx   . Uterine cancer Neg Hx     Social History   Socioeconomic History  . Marital status: Single    Spouse name: Not on file  . Number of children: Not  on file  . Years of education: Not on file  . Highest education level: Not on file  Occupational History  . Occupation: Press photographer at [employer]  Tobacco Use  . Smoking status: Never Smoker  . Smokeless tobacco: Never Used  Vaping Use  . Vaping Use: Never used  Substance and Sexual Activity  . Alcohol use: Not Currently  . Drug use: Never  . Sexual activity: Not Currently    Birth control/protection: Abstinence  Other Topics Concern  . Not on file  Social History Narrative  . Not on file   Social Determinants of Health   Financial Resource Strain:   . Difficulty of Paying Living Expenses:   Food Insecurity:   . Worried About Charity fundraiser in the Last Year:   . Arboriculturist in the Last Year:   Transportation Needs:   . Film/video editor (Medical):   Marland Kitchen Lack of Transportation (Non-Medical):   Physical Activity:   . Days of Exercise per Week:   . Minutes of Exercise per Session:   Stress:   . Feeling of Stress :   Social Connections:   . Frequency of Communication with Friends and Family:   . Frequency of Social Gatherings with Friends and Family:   . Attends Religious Services:   . Active Member of Clubs or Organizations:   . Attends Archivist Meetings:   Marland Kitchen Marital Status:     Current Medications:  Current Outpatient Medications:  .  docusate sodium (COLACE) 100 MG capsule, Take 1 capsule (100 mg total) by mouth daily as needed for mild constipation., Disp: 30 capsule, Rfl: 3 .  ferrous sulfate 325 (65 FE) MG EC tablet, Take 1 tablet (325 mg total) by mouth daily., Disp: 60 tablet, Rfl: 3 .  ibuprofen (ADVIL) 800 MG tablet, Take 1 tablet (800 mg total) by mouth every 8 (eight) hours as needed for moderate pain. For AFTER surgery (Patient not taking: Reported on 12/07/2019), Disp: 30 tablet, Rfl: 0 .  oxyCODONE (OXY IR/ROXICODONE) 5 MG immediate release tablet, Take 1 tablet (5 mg total) by mouth every 4 (four) hours as needed for severe pain.  For AFTER surgery, do not take and drive (Patient not taking: Reported on 12/07/2019), Disp: 15 tablet, Rfl: 0 .  senna (SENOKOT) 8.6 MG TABS tablet, Take 1 tablet (8.6 mg total) by mouth daily as needed for up to 120 doses for mild constipation., Disp: 30 tablet, Rfl: 3  Review of Symptoms: Complete review is negative except as above in Interval History.  Physical Exam: There were no vitals taken for this visit. Deferred given limitations of phone visit  Laboratory & Radiologic Studies: None new  Assessment & Plan: Devonna Oboyle is a 38 y.o. 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  The patient overall is feeling much better.  We discussed her incisional pruritus, which may be related to some skin allergy.  We discussed limiting lotions and other detergents/soaps that may be irritating her skin.  I will send a low potency steroid cream for her to use to hopefully alleviate her  symptoms.  She notes significant improvement in her energy level as well as her emotional state.  She feels ready to return to work but it sounds like work has said that she can take off until October.  We reviewed again plan for surveillance visits every 6 months.  She will call if she develops any issues prior to her next visit with me.  I discussed the assessment and treatment plan with the patient. The patient was provided with an opportunity to ask questions and all were answered. The patient agreed with the plan and demonstrated an understanding of the instructions.   The patient was advised to call back or see an in-person evaluation if the symptoms worsen or if the condition fails to improve as anticipated.   20 minutes of total time was spent for this patient encounter, including preparation, face-to-face counseling with the patient and coordination of care, and documentation of the encounter.   Jeral Pinch, MD  Division  of Gynecologic Oncology  Department of Obstetrics and Gynecology  [facility]

## 2019-12-20 ENCOUNTER — Encounter: Payer: Self-pay | Admitting: Gynecologic Oncology

## 2019-12-20 ENCOUNTER — Inpatient Hospital Stay: Payer: Self-pay | Attending: Gynecologic Oncology | Admitting: Gynecologic Oncology

## 2019-12-20 DIAGNOSIS — L299 Pruritus, unspecified: Secondary | ICD-10-CM

## 2019-12-20 DIAGNOSIS — C541 Malignant neoplasm of endometrium: Secondary | ICD-10-CM

## 2019-12-20 MED ORDER — TRIAMCINOLONE ACETONIDE 0.025 % EX OINT: 1.0000 "application " | TOPICAL_OINTMENT | Freq: Two times a day (BID) | CUTANEOUS | 0 refills | Status: DC

## 2019-12-24 ENCOUNTER — Telehealth: Payer: Self-pay | Admitting: *Deleted

## 2019-12-24 NOTE — Telephone Encounter (Signed)
Patient called and stated "I talked with the doctor last week and explained that I wasn't ready to go back to work on 8/10. She told me to call my work and see when I had to be back at work. My work said I have until 10/4. I just need a work letter stating that." Message forwarded to [facility] APP and Dr Berline Lopes

## 2019-12-25 NOTE — Telephone Encounter (Signed)
Told Shirley Pearson that she can return to work with no restrictions 6 weeks from surgery which is 12-25-19. If she is able to take time off until 02-18-20 that is fine, however, she does not need more time off from a medical stand point per Dr. Berline Lopes. Pt verbalized understanding.

## 2020-01-23 ENCOUNTER — Ambulatory Visit: Payer: Self-pay | Admitting: Gynecologic Oncology

## 2020-06-04 ENCOUNTER — Telehealth: Payer: Self-pay | Admitting: Oncology

## 2020-06-04 NOTE — Telephone Encounter (Signed)
Called [facility] and scheduled follow up appointment with Dr. Berline Lopes on 06/27/20 at 1:30.

## 2020-06-27 ENCOUNTER — Encounter: Payer: Self-pay | Admitting: Gynecologic Oncology

## 2020-06-27 ENCOUNTER — Inpatient Hospital Stay: Payer: Self-pay | Attending: Gynecologic Oncology | Admitting: Gynecologic Oncology

## 2020-06-27 ENCOUNTER — Other Ambulatory Visit: Payer: Self-pay

## 2020-06-27 VITALS — BP 154/74 | HR 80 | Temp 98.5°F | Resp 20 | Ht 60.0 in | Wt 164.0 lb

## 2020-06-27 DIAGNOSIS — Z6832 Body mass index (BMI) 32.0-32.9, adult: Secondary | ICD-10-CM | POA: Insufficient documentation

## 2020-06-27 DIAGNOSIS — Z9079 Acquired absence of other genital organ(s): Secondary | ICD-10-CM | POA: Insufficient documentation

## 2020-06-27 DIAGNOSIS — E669 Obesity, unspecified: Secondary | ICD-10-CM | POA: Insufficient documentation

## 2020-06-27 DIAGNOSIS — Z90710 Acquired absence of both cervix and uterus: Secondary | ICD-10-CM | POA: Insufficient documentation

## 2020-06-27 DIAGNOSIS — C541 Malignant neoplasm of endometrium: Secondary | ICD-10-CM | POA: Insufficient documentation

## 2020-06-27 NOTE — Patient Instructions (Signed)
Todo parece bien. No hay signo de cancer.  Quiero verle en 6 meses. Por favor, llame la clinica in [place] para hacer una visita in [place]. Si tiene algun simtoma antes (como sangre por la vagina, Social research officer, government), llame la clinica para verme antes de su proxima visita.  El numero [place] es: [phone].

## 2020-06-27 NOTE — Progress Notes (Signed)
Gynecologic Oncology Return Clinic Visit  06/27/20  Reason for Visit: Surveillance visit in the setting of early stage uterine cancer  Treatment History: Oncology History Overview Note  IHC MMR intact   Endometrial cancer ([place])  10/08/2019 Initial Biopsy   EMB - FIGO gr1 endometrioid adenoca  MMR IHC intact   10/10/2019 Initial Diagnosis   Endometrial cancer ([place])   10/22/2019 Surgery   D&C and Mirena IUD insertion  Path - FIGO gr 1   10/31/2019 Imaging   MRI pelvis: Uterus: Measures 14.8 x 8 5 by 12.2 cm (volume = 300 cm^3). A focal adenomyoma is seen in the left posterior uterine corpus measuring 4.7 cm. A large ill-defined heterogeneously enhancing endometrial mass is seen in the uterine body and corpus, consistent with known endometrial carcinoma. This shows deep myometrial invasion of greater than 50% in the right uterine fundus and corpus. No evidence extra uterine extension tumor. An IUD is visualized in inferior portion of the endometrial cavity in the lower uterine segment. Cervix and vagina are unremarkable.   11/13/2019 Surgery   Robotic-assisted laparoscopic total hysterectomy with bilateral salpingectomy, SLN biopsy, mini-lap for specimen removal    11/13/2019 Pathology Results   IA, grade 1 EMCA, <20% MI, no LVSI, neg SLNs     Interval History: The patient reports doing well since her last visit.  She has occasional fatigue and difficulty sleeping but nothing out of the ordinary.  She also has occasional twinges in her abdomen.  She endorses having an increased appetite, thinks she may have gained a little bit of weight.  Denies any nausea or emesis.  Reports regular bowel and bladder function.  Denies any vaginal bleeding or discharge.  Denies pelvic pain.  Has had pain in the last couple of months that last for a day or 2 consistent with ovulation.  Past Medical/Surgical History: Past Medical History:  Diagnosis Date  . Abnormal uterine bleeding   . Anemia    . Complex atypical endometrial hyperplasia   . Obesity (BMI 30-39.9)     Past Surgical History:  Procedure Laterality Date  . DILATION AND CURETTAGE OF UTERUS  yrs ago  . DILATION AND CURETTAGE OF UTERUS N/A 10/22/2019   Procedure: DILATATION AND CURETTAGE;  Surgeon: Lafonda Mosses, MD;  Location: [facility];  Service: Gynecology;  Laterality: N/A;  . INTRAUTERINE DEVICE (IUD) INSERTION N/A 10/22/2019   Procedure: INTRAUTERINE DEVICE (IUD) INSERTION;  Surgeon: Lafonda Mosses, MD;  Location: [facility];  Service: Gynecology;  Laterality: N/A;  . OPERATIVE ULTRASOUND N/A 10/22/2019   Procedure: OPERATIVE ULTRASOUND;  Surgeon: Lafonda Mosses, MD;  Location: [facility];  Service: Gynecology;  Laterality: N/A;  . ROBOTIC ASSISTED TOTAL HYSTERECTOMY N/A 11/13/2019   Procedure: XI ROBOTIC ASSISTED HYSTERECTOMY, SALPINGECTOMY, SENTINEL NODE BIOPSY LYMPH NODE DISSECTION, MINI LAPAROTOMY;  Surgeon: Lafonda Mosses, MD;  Location: [facility];  Service: Gynecology;  Laterality: N/A;    Family History  Problem Relation Age of Onset  . Diabetes Mother   . Colon cancer Neg Hx   . Breast cancer Neg Hx   . Ovarian cancer Neg Hx   . Uterine cancer Neg Hx     Social History   Socioeconomic History  . Marital status: Single    Spouse name: Not on file  . Number of children: Not on file  . Years of education: Not on file  . Highest education level: Not on file  Occupational History  . Occupation: Press photographer  at [employer]  Tobacco Use  . Smoking status: Never Smoker  . Smokeless tobacco: Never Used  Vaping Use  . Vaping Use: Never used  Substance and Sexual Activity  . Alcohol use: Not Currently  . Drug use: Never  . Sexual activity: Not Currently    Birth control/protection: Abstinence  Other Topics Concern  . Not on file  Social History Narrative  . Not on file   Social Determinants of Health   Financial Resource Strain: Not  on file  Food Insecurity: Not on file  Transportation Needs: Not on file  Physical Activity: Not on file  Stress: Not on file  Social Connections: Not on file    Current Medications: No current outpatient medications on file.  Review of Systems: Denies appetite changes, fevers, chills, fatigue, unexplained weight changes. Denies hearing loss, neck lumps or masses, mouth sores, ringing in ears or voice changes. Denies cough or wheezing.  Denies shortness of breath. Denies chest pain or palpitations. Denies leg swelling. Denies abdominal distention, pain, blood in stools, constipation, diarrhea, nausea, vomiting, or early satiety. Denies pain with intercourse, dysuria, frequency, hematuria or incontinence. Denies hot flashes, pelvic pain, vaginal bleeding or vaginal discharge.   Denies joint pain, back pain or muscle pain/cramps. Denies itching, rash, or wounds. Denies dizziness, headaches, numbness or seizures. Denies swollen lymph nodes or glands, denies easy bruising or bleeding. Denies anxiety, depression, confusion, or decreased concentration.  Physical Exam: BP (!) 154/74 (BP Location: Left Arm, Patient Position: Sitting)   Pulse 80   Temp 98.5 F (36.9 C) (Tympanic)   Resp 20   Ht 5' (1.524 m)   Wt 164 lb (74.4 kg)   SpO2 100% Comment: RA  BMI 32.03 kg/m  General: Alert, oriented, no acute distress. HEENT: Normocephalic, atraumatic, sclera anicteric. Chest: Unlabored breathing on room air. Abdomen: soft, nontender.  Normoactive bowel sounds.  No masses or hepatosplenomegaly appreciated.  Well-healed incisions, keloiding of her midline mini laparotomy. Extremities: Grossly normal range of motion.  Warm, well perfused.  No edema bilaterally. Skin: No rashes or lesions noted. Lymphatics: No cervical, supraclavicular, or inguinal adenopathy. GU: Normal appearing external genitalia without erythema, excoriation, or lesions.  Speculum exam reveals cuff intact, no lesions or  masses.  No bleeding or discharge.  Bimanual exam reveals cuff intact, no nodularity.  Rectovaginal exam confirms findings.  Laboratory & Radiologic Studies: None new  Assessment & Plan: Shirley Pearson is a 40 y.o. 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  Patient is doing very well today and is NED on exam.  She has been contacted by [facility] to participate in a research study for endometrial cancer patients and I have encouraged her to do this.  In the setting of low risk early stage endometrial cancer, we discussed NCCN and SGO surveillance recommendations of follow-up visit every 6 months for the first year.  We can then consider transitioning to yearly visits.  I reviewed the signs and symptoms that would be concerning for disease recurrence and that should prompt a phone call prior to her next scheduled visit.  24 minutes of total time was spent for this patient encounter, including preparation, face-to-face counseling with the patient and coordination of care, and documentation of the encounter.  Jeral Pinch, MD  Division of Gynecologic Oncology  Department of Obstetrics and Gynecology  [facility]

## 2020-12-26 ENCOUNTER — Other Ambulatory Visit: Payer: Self-pay

## 2020-12-26 ENCOUNTER — Encounter: Payer: Self-pay | Admitting: Gynecologic Oncology

## 2020-12-26 ENCOUNTER — Inpatient Hospital Stay: Payer: Self-pay | Attending: Gynecologic Oncology | Admitting: Gynecologic Oncology

## 2020-12-26 VITALS — BP 148/88 | HR 80 | Temp 97.7°F | Resp 20 | Wt 165.4 lb

## 2020-12-26 DIAGNOSIS — C541 Malignant neoplasm of endometrium: Secondary | ICD-10-CM

## 2020-12-26 DIAGNOSIS — Z0000 Encounter for general adult medical examination without abnormal findings: Secondary | ICD-10-CM

## 2020-12-26 DIAGNOSIS — Z90722 Acquired absence of ovaries, bilateral: Secondary | ICD-10-CM | POA: Insufficient documentation

## 2020-12-26 DIAGNOSIS — M549 Dorsalgia, unspecified: Secondary | ICD-10-CM | POA: Insufficient documentation

## 2020-12-26 DIAGNOSIS — E669 Obesity, unspecified: Secondary | ICD-10-CM | POA: Insufficient documentation

## 2020-12-26 DIAGNOSIS — R5383 Other fatigue: Secondary | ICD-10-CM

## 2020-12-26 DIAGNOSIS — Z90710 Acquired absence of both cervix and uterus: Secondary | ICD-10-CM | POA: Insufficient documentation

## 2020-12-26 DIAGNOSIS — Z6832 Body mass index (BMI) 32.0-32.9, adult: Secondary | ICD-10-CM | POA: Insufficient documentation

## 2020-12-26 NOTE — Patient Instructions (Signed)
It was good to see you today!  I will see you in 6 months for follow-up.  I do not see or feel any evidence of cancer recurrence on your exam today.  As always, please call if you develop any concerning symptoms before your next visit, such as vaginal bleeding or pelvic pain.  Schedule is not out past December of this year.  Please call in December to get a visit scheduled with me in February.  Please let me know if you start having more frequent back pain, or pain that does not respond to Tylenol.

## 2020-12-26 NOTE — Progress Notes (Signed)
Gynecologic Oncology Return Clinic Visit  12/26/20  Reason for Visit: Surveillance visit in the setting of early stage uterine cancer  Treatment History: Oncology History Overview Note  IHC MMR intact   Endometrial cancer ([place])  10/08/2019 Initial Biopsy   EMB - FIGO gr1 endometrioid adenoca  MMR IHC intact   10/10/2019 Initial Diagnosis   Endometrial cancer ([place])   10/22/2019 Surgery   D&C and Mirena IUD insertion  Path - FIGO gr 1   10/31/2019 Imaging   MRI pelvis: Uterus: Measures 14.8 x 8 5 by 12.2 cm (volume = 300 cm^3). A focal adenomyoma is seen in the left posterior uterine corpus measuring 4.7 cm. A large ill-defined heterogeneously enhancing endometrial mass is seen in the uterine body and corpus, consistent with known endometrial carcinoma. This shows deep myometrial invasion of greater than 50% in the right uterine fundus and corpus. No evidence extra uterine extension tumor. An IUD is visualized in inferior portion of the endometrial cavity in the lower uterine segment. Cervix and vagina are unremarkable.   11/13/2019 Surgery   Robotic-assisted laparoscopic total hysterectomy with bilateral salpingectomy, SLN biopsy, mini-lap for specimen removal    11/13/2019 Pathology Results   IA, grade 1 EMCA, <20% MI, no LVSI, neg SLNs     Interval History: The patient presents today for a surveillance visit.  She notes overall doing well.  She had some mid to low back pain last week that was relieved with over-the-counter pain medicine.  She does not think she did any heavy lifting or strenuous activity before this happened.  She also notes some fatigue, she is sleeping about 7 hours a night and thinks that she is eating well.  She denies any vaginal bleeding or discharge.  She denies any abdominal or pelvic pain.  She has occasional twinges of pain related to her incisions.  She reports normal bowel and bladder function.  Past Medical/Surgical History: Past Medical History:   Diagnosis Date   Abnormal uterine bleeding    Anemia    Complex atypical endometrial hyperplasia    Obesity (BMI 30-39.9)     Past Surgical History:  Procedure Laterality Date   DILATION AND CURETTAGE OF UTERUS  yrs ago   DILATION AND CURETTAGE OF UTERUS N/A 10/22/2019   Procedure: DILATATION AND CURETTAGE;  Surgeon: Lafonda Mosses, MD;  Location: [facility];  Service: Gynecology;  Laterality: N/A;   INTRAUTERINE DEVICE (IUD) INSERTION N/A 10/22/2019   Procedure: INTRAUTERINE DEVICE (IUD) INSERTION;  Surgeon: Lafonda Mosses, MD;  Location: [facility];  Service: Gynecology;  Laterality: N/A;   OPERATIVE ULTRASOUND N/A 10/22/2019   Procedure: OPERATIVE ULTRASOUND;  Surgeon: Lafonda Mosses, MD;  Location: [facility];  Service: Gynecology;  Laterality: N/A;   ROBOTIC ASSISTED TOTAL HYSTERECTOMY N/A 11/13/2019   Procedure: XI ROBOTIC ASSISTED HYSTERECTOMY, SALPINGECTOMY, SENTINEL NODE BIOPSY LYMPH NODE DISSECTION, MINI LAPAROTOMY;  Surgeon: Lafonda Mosses, MD;  Location: [facility];  Service: Gynecology;  Laterality: N/A;    Family History  Problem Relation Age of Onset   Diabetes Mother    Colon cancer Neg Hx    Breast cancer Neg Hx    Ovarian cancer Neg Hx    Uterine cancer Neg Hx     Social History   Socioeconomic History   Marital status: Single    Spouse name: Not on file   Number of children: Not on file   Years of education: Not on file   Highest education  level: Not on file  Occupational History   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  Tobacco Use   Smoking status: Never   Smokeless tobacco: Never  Vaping Use   Vaping Use: Never used  Substance and Sexual Activity   Alcohol use: Not Currently   Drug use: Never   Sexual activity: Not Currently    Birth control/protection: Abstinence  Other Topics Concern   Not on file  Social History Narrative   Not on file   Social Determinants of Health   Financial  Resource Strain: Not on file  Food Insecurity: Not on file  Transportation Needs: Not on file  Physical Activity: Not on file  Stress: Not on file  Social Connections: Not on file    Current Medications: No current outpatient medications on file.  Review of Systems: Pertinent positives include recent history of back pain, headache. Denies appetite changes, fevers, chills, fatigue, unexplained weight changes. Denies hearing loss, neck lumps or masses, mouth sores, ringing in ears or voice changes. Denies cough or wheezing.  Denies shortness of breath. Denies chest pain or palpitations. Denies leg swelling. Denies abdominal distention, pain, blood in stools, constipation, diarrhea, nausea, vomiting, or early satiety. Denies pain with intercourse, dysuria, frequency, hematuria or incontinence. Denies hot flashes, pelvic pain, vaginal bleeding or vaginal discharge.   Denies joint pain or muscle pain/cramps. Denies itching, rash, or wounds. Denies dizziness, numbness or seizures. Denies swollen lymph nodes or glands, denies easy bruising or bleeding. Denies anxiety, depression, confusion, or decreased concentration.  Physical Exam: BP (!) 148/88 (BP Location: Right Arm, Patient Position: Sitting)   Pulse 80   Temp 97.7 F (36.5 C)   Resp 20   Wt 165 lb 6.4 oz (75 kg)   SpO2 100%   BMI 32.30 kg/m  General: Alert, oriented, no acute distress. HEENT: Normocephalic, atraumatic, sclera anicteric. Chest: Clear to auscultation bilaterally.  No wheezes or rhonchi. Cardiovascular: Regular rate and rhythm, no murmurs. Abdomen: Obese, soft, nontender.  Normoactive bowel sounds.  No masses or hepatosplenomegaly appreciated.  Well-healed scar, keloid scar of her midline mini laparotomy incision. Extremities: Grossly normal range of motion.  Warm, well perfused.  No edema bilaterally. Skin: No rashes or lesions noted. Lymphatics: No cervical, supraclavicular, or inguinal adenopathy. GU:  Normal appearing external genitalia without erythema, excoriation, or lesions.  Speculum exam reveals well rugated vaginal mucosa, no lesions or masses.  No bleeding or discharge.  Bimanual exam reveals cuff intact, no nodularity or masses.  Rectovaginal exam confirms these findings.  Laboratory & Radiologic Studies: None new  Assessment & Plan: Shirley Pearson is a 40 y.o. 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  The patient is overall doing well with no evidence of disease on exam today.  We discussed both her fatigue as well as her back pain.  I made some suggestions in terms of improving her water intake as well as changes to her sleep and sleep hygiene to see if this improves her fatigue.  In terms of her back pain, it is overall reassuring that this was limited and resolved with time and medication.  I have asked her to contact me if she develops pain again.  In the setting of low risk early stage endometrial cancer, we discussed NCCN and SGO surveillance recommendations of follow-up visit every 6 months for the first year.  We will continue with visits every 6 months at this time.  At some point, we  may consider transitioning to yearly visits before she reaches 5 years out from definitive treatment.  I reviewed the signs and symptoms that would be concerning for disease recurrence and that should prompt a phone call prior to her next scheduled visit.  The patient does not have a primary care provider.  She was amenable to me placing a referral today.  I placed a referral for family practice within the [facility] health system.  34 minutes of total time was spent for this patient encounter, including preparation, face-to-face counseling with the patient and coordination of care, and documentation of the encounter.  Jeral Pinch, MD  Division of Gynecologic Oncology  Department of Obstetrics and Gynecology  [facility]

## 2021-04-04 IMAGING — MR MR PELVIS WO/W CM
15 of 19 series · 37 of 48 positions shown · IV contrast (gadavist)
Comparison: None.

CLINICAL DATA: Newly diagnosed endometrial carcinoma.  Staging.

EXAM:
MRI PELVIS WITHOUT AND WITH CONTRAST
TECHNIQUE: Multiplanar multisequence MR imaging of the pelvis was performed
both before and after administration of intravenous contrast.
CONTRAST:  7mL GADAVIST GADOBUTROL 1 MMOL/ML IV SOLN

[Series 1: 3 plane loc · axial · 3.0mm · 1.56mm/px · 1 of 27 slices shown]
[im 1/27]
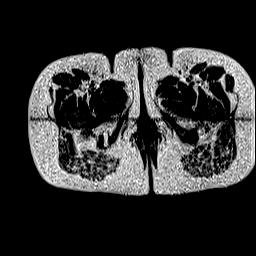

[Series 3: T2 · axial · 5.0mm · 0.51mm/px · 1 of 42 slices shown (1 of 3)]
[im 1/42]
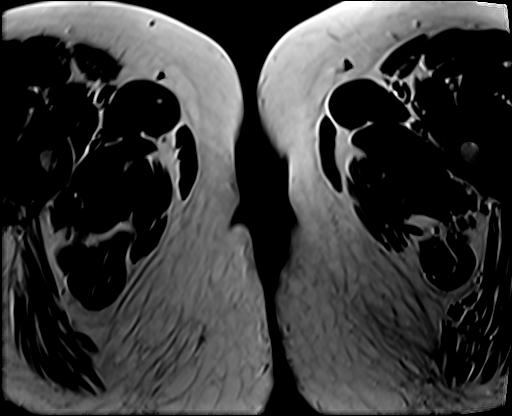

[Series 4: T2 fat-sat · axial · 5.0mm · 0.51mm/px · 1 of 42 slices shown]
[im 1/42]
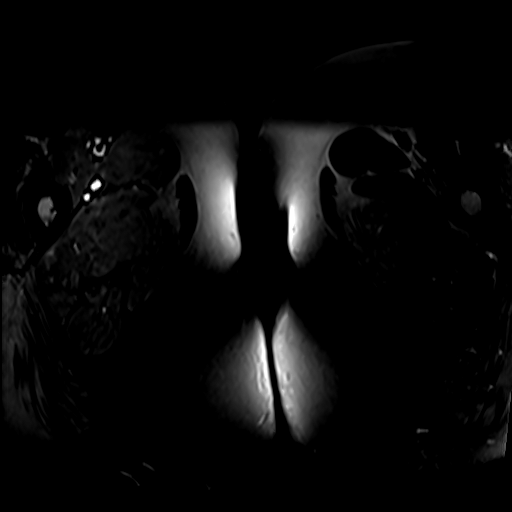

[Series 5: T2 · sagittal · 5.0mm · 0.55mm/px · 2 of 40 slices shown (2 of 3)]
[im 1/40]
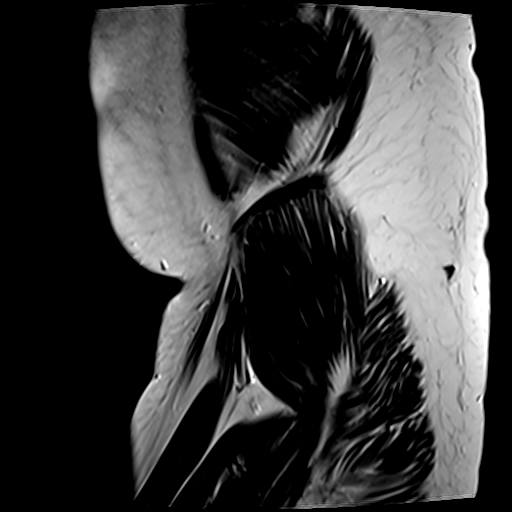
[im 40/40]
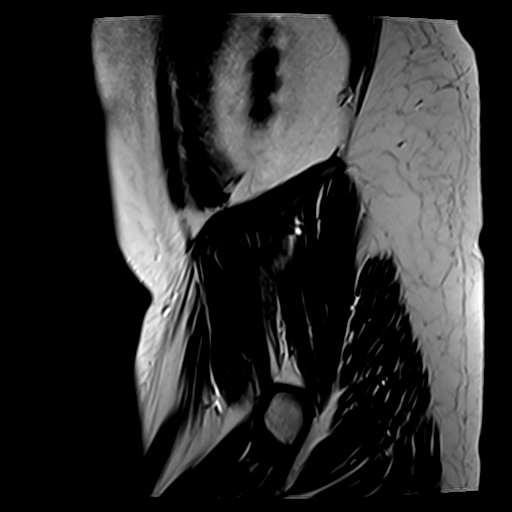

[Series 6: T2 · coronal · 6.0mm · 1.02mm/px · 2 of 30 slices shown (3 of 3)]
[im 1/30]
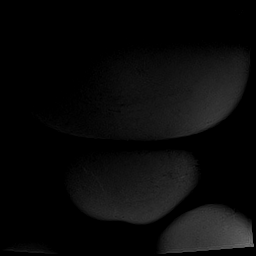
[im 30/30]
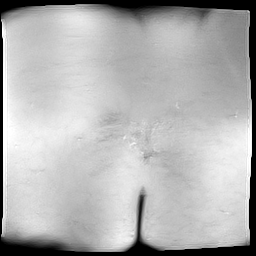

[Series 7: T1 · axial · 5.0mm · 0.51mm/px · z∈[-66,+180]mm · 2 of 42 slices shown (1 of 7)]
[im 1/42]
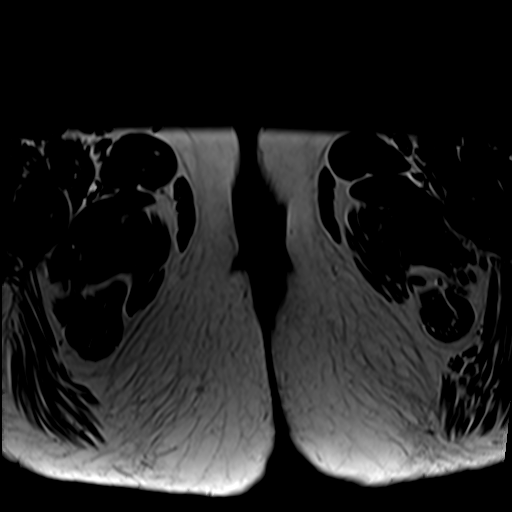
[im 42/42]
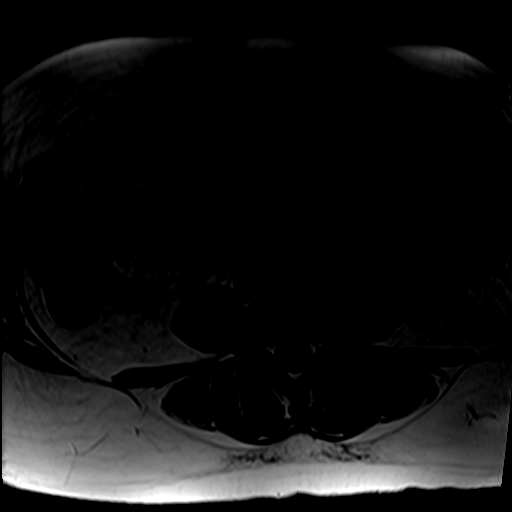

[Series 8: T1 fat-sat · axial · non-contrast · 5.0mm · 0.51mm/px · z∈[-66,+180]mm · 2 of 42 slices shown]
[im 1/42]
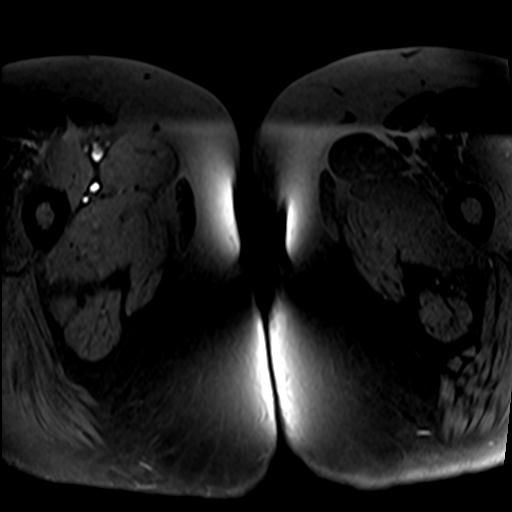
[im 42/42]
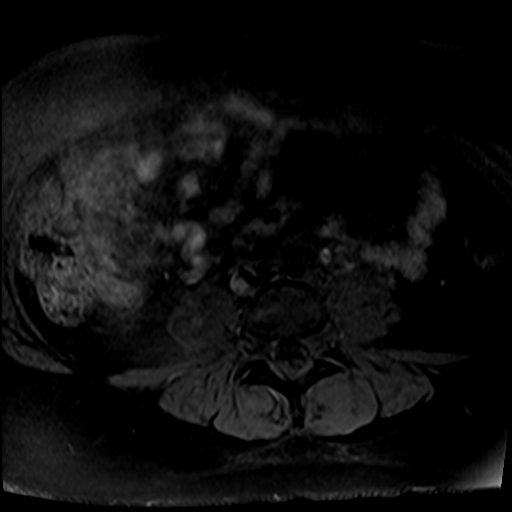

[Series 9: T1 · axial · 3.0mm · 1.05mm/px · z∈[-33,+228]mm · 5 of 88 slices shown (2 of 7)]
[im 1/88]
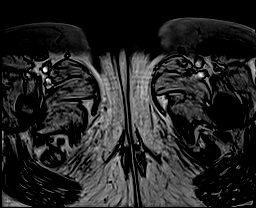
[im 22/88]
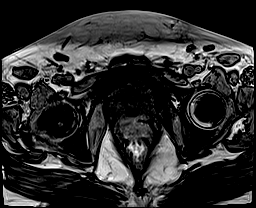
[im 44/88]
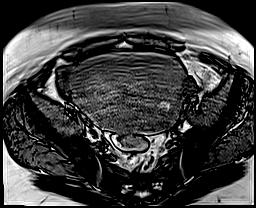
[im 66/88]
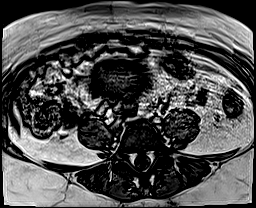
[im 88/88]
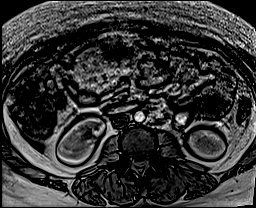

[Series 10: T1 · axial · 3.0mm · 1.05mm/px · z∈[-33,+228]mm · 5 of 88 slices shown (3 of 7)]
[im 1/88]
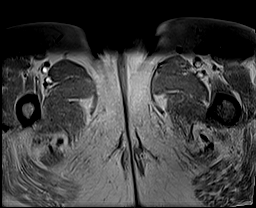
[im 22/88]
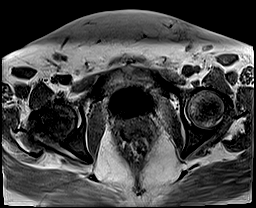
[im 44/88]
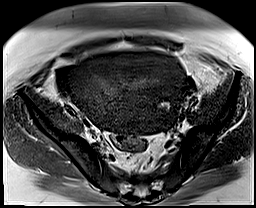
[im 66/88]
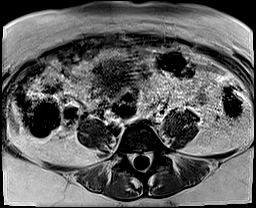
[im 88/88]
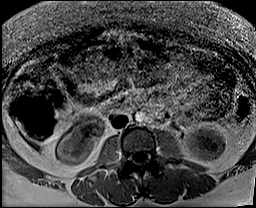

[Series 11: T1 · sagittal · 4.0mm · 0.90mm/px · 3 of 52 slices shown (4 of 7)]
[im 1/52]
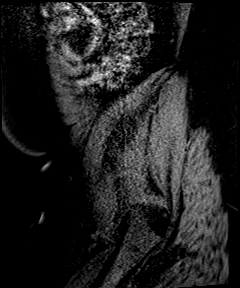
[im 26/52]
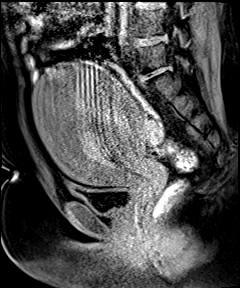
[im 52/52]
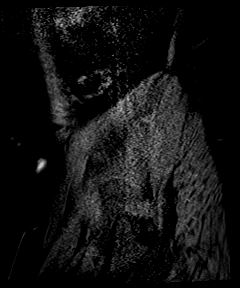

[Series 13: T1 · sagittal · 4.0mm · 0.90mm/px · 3 of 52 slices shown (5 of 7)]
[im 1/52]
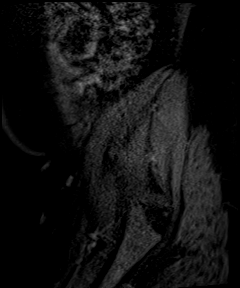
[im 26/52]
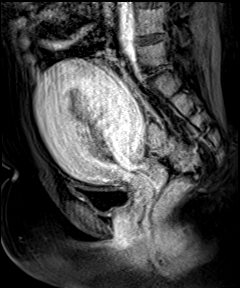
[im 52/52]
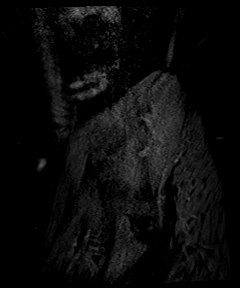

[Series 14: T1 · sagittal · 4.0mm · 0.90mm/px · 3 of 52 slices shown (6 of 7)]
[im 1/52]
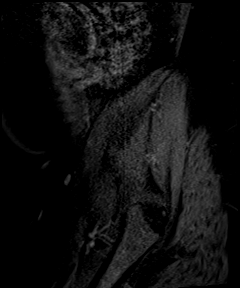
[im 26/52]
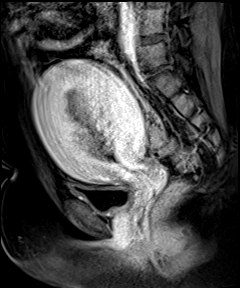
[im 52/52]
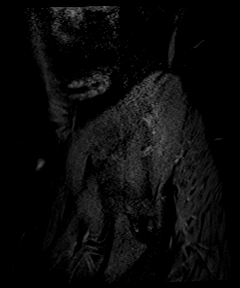

[Series 15: T1 · sagittal · 4.0mm · 0.90mm/px · 3 of 52 slices shown (7 of 7)]
[im 1/52]
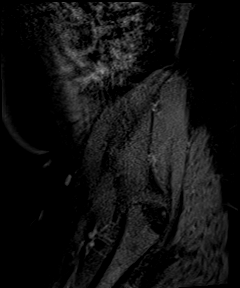
[im 26/52]
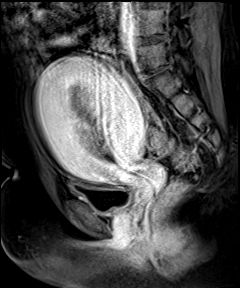
[im 52/52]
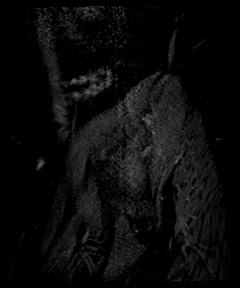

[Series 16: T1 fat-sat post-contrast · axial · 5.0mm · 0.51mm/px · z∈[-66,+180]mm · 2 of 42 slices shown (1 of 2)]
[im 1/42]
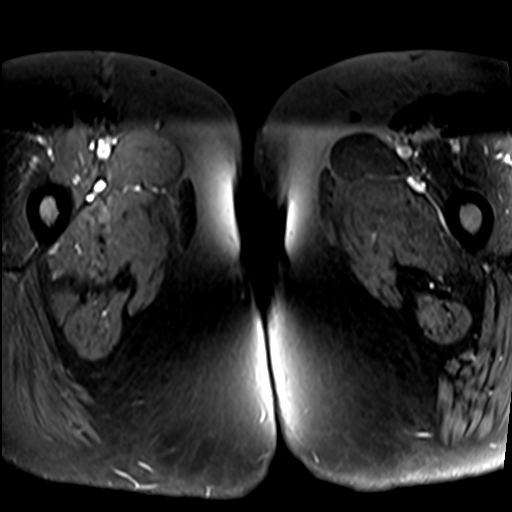
[im 42/42]
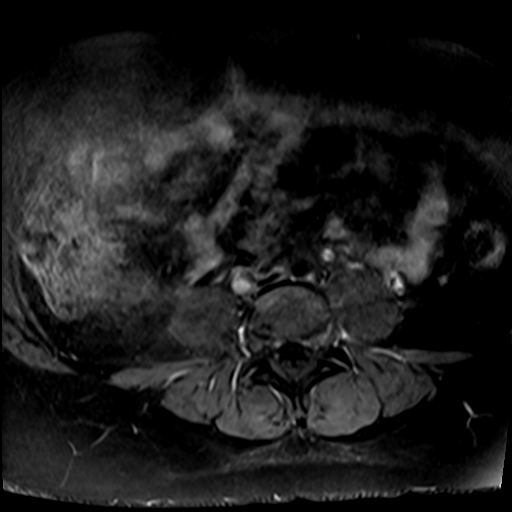

[Series 17: T1 fat-sat post-contrast · coronal · 6.0mm · 0.51mm/px · 2 of 30 slices shown (2 of 2)]
[im 1/30]
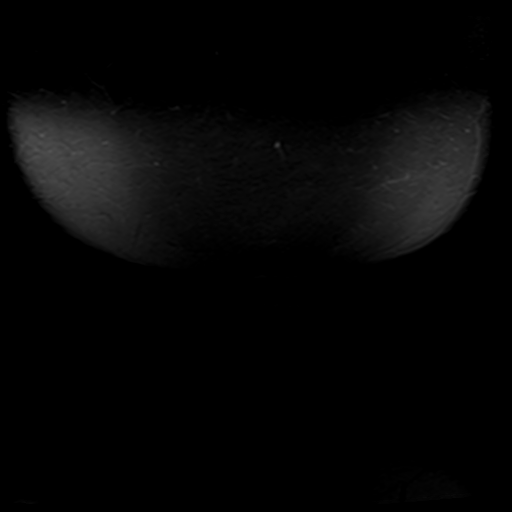
[im 30/30]
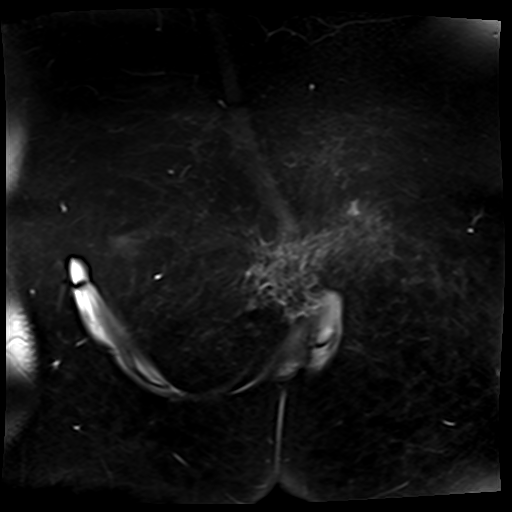

[37 of 48 positions shown; findings below may reference images not displayed]

FINDINGS: Lower Urinary Tract: No urinary bladder or urethral abnormality
identified.

Bowel: Unremarkable appearance of rectum and other pelvic bowel
loops.

Vascular/Lymphatic: Unremarkable. No pathologically enlarged pelvic
lymph nodes identified.

Reproductive:

-- Uterus: Measures 14.8 x 8 5 by 12.2 cm (volume = 300 cm^3). A
focal adenomyoma is seen in the left posterior uterine corpus
measuring 4.7 cm. A large ill-defined heterogeneously enhancing
endometrial mass is seen in the uterine body and corpus, consistent
with known endometrial carcinoma. This shows deep myometrial
invasion of greater than 50% in the right uterine fundus and corpus.
No evidence extra uterine extension tumor. An IUD is visualized in
inferior portion of the endometrial cavity in the lower uterine
segment. Cervix and vagina are unremarkable.

-- Right ovary: Appears normal. No ovarian or adnexal masses
identified.

-- Left ovary: Appears normal. No ovarian or adnexal masses
identified.

Other: No peritoneal thickening or abnormal free fluid.

Musculoskeletal:  Unremarkable.
IMPRESSION: 1. Large ill-defined endometrial mass in the uterine body and
corpus, consistent with known endometrial carcinoma. This shows deep
myometrial invasion of >50% in the right uterine fundus and corpus.
(FIGO stage IB)
2. No evidence of extra-uterine extension tumor or pelvic metastatic
disease.
3. Focal adenomyoma in the left posterior uterine corpus
incidentally noted.
4. Normal appearance of both ovaries.

## 2021-10-19 ENCOUNTER — Encounter: Payer: Self-pay | Admitting: Gynecologic Oncology

## 2021-10-22 ENCOUNTER — Inpatient Hospital Stay: Payer: Self-pay | Attending: Gynecologic Oncology | Admitting: Gynecologic Oncology

## 2021-10-22 ENCOUNTER — Inpatient Hospital Stay: Payer: Self-pay

## 2021-10-22 ENCOUNTER — Other Ambulatory Visit: Payer: Self-pay

## 2021-10-22 ENCOUNTER — Encounter: Payer: Self-pay | Admitting: Gynecologic Oncology

## 2021-10-22 VITALS — BP 148/90 | HR 83 | Temp 99.1°F | Resp 16 | Ht 60.0 in | Wt 163.0 lb

## 2021-10-22 DIAGNOSIS — R3915 Urgency of urination: Secondary | ICD-10-CM | POA: Insufficient documentation

## 2021-10-22 DIAGNOSIS — Z8542 Personal history of malignant neoplasm of other parts of uterus: Secondary | ICD-10-CM | POA: Insufficient documentation

## 2021-10-22 DIAGNOSIS — R5383 Other fatigue: Secondary | ICD-10-CM

## 2021-10-22 DIAGNOSIS — C541 Malignant neoplasm of endometrium: Secondary | ICD-10-CM

## 2021-10-22 DIAGNOSIS — R350 Frequency of micturition: Secondary | ICD-10-CM

## 2021-10-22 DIAGNOSIS — R232 Flushing: Secondary | ICD-10-CM | POA: Insufficient documentation

## 2021-10-22 DIAGNOSIS — Z90710 Acquired absence of both cervix and uterus: Secondary | ICD-10-CM | POA: Insufficient documentation

## 2021-10-22 DIAGNOSIS — R251 Tremor, unspecified: Secondary | ICD-10-CM | POA: Insufficient documentation

## 2021-10-22 DIAGNOSIS — R519 Headache, unspecified: Secondary | ICD-10-CM | POA: Insufficient documentation

## 2021-10-22 DIAGNOSIS — Z08 Encounter for follow-up examination after completed treatment for malignant neoplasm: Secondary | ICD-10-CM | POA: Insufficient documentation

## 2021-10-22 DIAGNOSIS — R42 Dizziness and giddiness: Secondary | ICD-10-CM | POA: Insufficient documentation

## 2021-10-22 LAB — URINALYSIS, COMPLETE (UACMP) WITH MICROSCOPIC
Bilirubin Urine: NEGATIVE
Glucose, UA: NEGATIVE mg/dL
Ketones, ur: NEGATIVE mg/dL
Leukocytes,Ua: NEGATIVE
Nitrite: NEGATIVE
Protein, ur: NEGATIVE mg/dL
Specific Gravity, Urine: 1.026 (ref 1.005–1.030)
pH: 5 (ref 5.0–8.0)

## 2021-10-22 LAB — CBC (CANCER CENTER ONLY)
HCT: 39.2 % (ref 36.0–46.0)
Hemoglobin: 13.7 g/dL (ref 12.0–15.0)
MCH: 29.2 pg (ref 26.0–34.0)
MCHC: 34.9 g/dL (ref 30.0–36.0)
MCV: 83.6 fL (ref 80.0–100.0)
Platelet Count: 409 10*3/uL — ABNORMAL HIGH (ref 150–400)
RBC: 4.69 MIL/uL (ref 3.87–5.11)
RDW: 12.8 % (ref 11.5–15.5)
WBC Count: 7.5 10*3/uL (ref 4.0–10.5)
nRBC: 0 % (ref 0.0–0.2)

## 2021-10-22 LAB — HEMOGLOBIN A1C
Hgb A1c MFr Bld: 6.6 % — ABNORMAL HIGH (ref 4.8–5.6)
Mean Plasma Glucose: 142.72 mg/dL

## 2021-10-22 NOTE — Progress Notes (Signed)
Gynecologic Oncology Return Clinic Visit  10/22/21  Reason for Visit: Surveillance visit in the setting of early stage uterine cancer  Treatment History: Oncology History Overview Note  IHC MMR intact   Endometrial cancer ([place])  10/08/2019 Initial Biopsy   EMB - FIGO gr1 endometrioid adenoca  MMR IHC intact   10/10/2019 Initial Diagnosis   Endometrial cancer ([place])   10/22/2019 Surgery   D&C and Mirena IUD insertion  Path - FIGO gr 1   10/31/2019 Imaging   MRI pelvis: Uterus: Measures 14.8 x 8 5 by 12.2 cm (volume = 300 cm^3). A focal adenomyoma is seen in the left posterior uterine corpus measuring 4.7 cm. A large ill-defined heterogeneously enhancing endometrial mass is seen in the uterine body and corpus, consistent with known endometrial carcinoma. This shows deep myometrial invasion of greater than 50% in the right uterine fundus and corpus. No evidence extra uterine extension tumor. An IUD is visualized in inferior portion of the endometrial cavity in the lower uterine segment. Cervix and vagina are unremarkable.   11/13/2019 Surgery   Robotic-assisted laparoscopic total hysterectomy with bilateral salpingectomy, SLN biopsy, mini-lap for specimen removal    11/13/2019 Pathology Results   IA, grade 1 EMCA, <20% MI, no LVSI, neg SLNs    Interval History: Patient reports overall doing well since her last visit although has had increased fatigue starting a month ago.  She began having what she describes as hot flashes for 2 weeks and then had an episode in the evening where she felt very "strange", tired, and like she might pass out.  She was shaking at this time.  She laid down and felt much better the next day although continued to have a headache.  She denies any symptoms of hot flashes since.  Patient denies any vaginal bleeding or discharge.  She reports normal bowel function.  Endorses some urinary urgency.  Has occasional twinges at the sites of her incision.  Past  Medical/Surgical History: Past Medical History:  Diagnosis Date   Abnormal uterine bleeding    Anemia    Complex atypical endometrial hyperplasia    Obesity (BMI 30-39.9)     Past Surgical History:  Procedure Laterality Date   DILATION AND CURETTAGE OF UTERUS  yrs ago   DILATION AND CURETTAGE OF UTERUS N/A 10/22/2019   Procedure: DILATATION AND CURETTAGE;  Surgeon: Lafonda Mosses, MD;  Location: [facility]  Service: Gynecology;  Laterality: N/A;   INTRAUTERINE DEVICE (IUD) INSERTION N/A 10/22/2019   Procedure: INTRAUTERINE DEVICE (IUD) INSERTION;  Surgeon: Lafonda Mosses, MD;  Location: [facility];  Service: Gynecology;  Laterality: N/A;   OPERATIVE ULTRASOUND N/A 10/22/2019   Procedure: OPERATIVE ULTRASOUND;  Surgeon: Lafonda Mosses, MD;  Location: [facility];  Service: Gynecology;  Laterality: N/A;   ROBOTIC ASSISTED TOTAL HYSTERECTOMY N/A 11/13/2019   Procedure: XI ROBOTIC ASSISTED HYSTERECTOMY, SALPINGECTOMY, SENTINEL NODE BIOPSY LYMPH NODE DISSECTION, MINI LAPAROTOMY;  Surgeon: Lafonda Mosses, MD;  Location: [facility];  Service: Gynecology;  Laterality: N/A;    Family History  Problem Relation Age of Onset   Diabetes Mother    Colon cancer Neg Hx    Breast cancer Neg Hx    Ovarian cancer Neg Hx    Uterine cancer Neg Hx     Social History   Socioeconomic History   Marital status: Single    Spouse name: Not on file   Number of children: Not on file   Years of  education: Not on file   Highest education level: Not on file  Occupational History   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  Review of Systems: Pertinent positives as per HPI. Denies appetite changes, fevers, chills, unexplained weight changes. Denies hearing loss, neck lumps or masses, mouth sores, ringing in ears or voice changes. Denies cough or wheezing.  Denies shortness of breath. Denies chest pain or palpitations. Denies leg swelling. Denies abdominal distention, pain, blood in stools, constipation, diarrhea, nausea, vomiting, or early satiety. Denies pain with intercourse, dysuria, hematuria or incontinence. Denies pelvic pain, vaginal bleeding or vaginal discharge.   Denies joint pain, back pain or muscle pain/cramps. Denies itching, rash, or wounds. Denies numbness or seizures. Denies swollen lymph nodes or glands, denies easy bruising or bleeding. Denies anxiety, depression, confusion, or decreased concentration.  Physical Exam: BP (!) 148/90 (BP Location: Left Arm, Patient Position: Sitting)   Pulse 83   Temp 99.1 F (37.3 C) (Tympanic)   Resp 16   Ht 5' (1.524 m)   Wt 163 lb (73.9 kg)   SpO2 100%   BMI 31.83 kg/m  General: Alert, oriented, no acute distress. HEENT: Normocephalic, atraumatic, sclera anicteric. Chest: Unlabored breathing on room air. Cardiovascular: Regular rate and rhythm, no murmurs. Abdomen: Obese, soft, nontender.  Normoactive bowel sounds.  No masses or hepatosplenomegaly appreciated.  Well-healed incisions. Extremities: Grossly normal range of motion.  Warm, well perfused.  No edema bilaterally. Skin: No rashes or lesions noted. Lymphatics: No cervical, supraclavicular, or inguinal adenopathy. GU: Normal appearing external genitalia without erythema, excoriation, or lesions.  Speculum exam reveals well  rugated vaginal mucosa, no lesions or masses.  No bleeding or discharge.  Bimanual exam reveals cuff intact, no nodularity or masses.  Rectovaginal exam confirms these findings.  Laboratory & Radiologic Studies: None new  Assessment & Plan: Shirley Pearson is a 41 y.o. 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 Surgery 10/2019. MMR intact.  Patient is NED on exam today.  She continues to report fatigue.  This was something that she discussed with me at her last visit.  She has had several weeks of symptoms recently that included increased fatigue, hot flashes, dizziness, shaking, and feeling near syncopal.  She has had some headaches since then.  Duration of her symptoms seem to mean that they are unrelated to menopausal symptoms (ovaries were left in situ).  Given urinary symptoms, we will plan for urinalysis today.  We will plan to check CBC to assure normal white count and no significant anemia.  We will also plan to get TSH and hemoglobin A1c to rule out thyroid disease or hypo-/hyperglycemia as the cause of her symptoms.  The patient has still not establish care with her primary care provider.  I  had my office print out some information for her so that she can get established.  In the setting of low risk early stage endometrial cancer, we discussed NCCN and SGO surveillance recommendations of follow-up visit every 6 months for the first year.  We will continue with visits every 6 months at this time.  She continues to do well at her next visit, we will transition to visits alternating between my office and her OB/GYN.  I reviewed the signs and symptoms that would be concerning for disease recurrence and that should prompt a phone call prior to her next scheduled visit.   32 minutes of total time was spent for this patient encounter, including preparation, face-to-face counseling with the patient and coordination of care, and documentation of the  encounter.  Jeral Pinch, MD  Division of Gynecologic Oncology  Department of Obstetrics and Gynecology  [facility]

## 2021-10-22 NOTE — Patient Instructions (Addendum)
Was good to see you today.  I do not see or feel any evidence of cancer recurrence on your exam.  I will plan to see you back in 6 months.  Please call sometime in September or October to schedule a visit with me in December.  As always, please call if you develop any new and concerning symptoms before your next visit.

## 2021-10-23 LAB — TSH: TSH: 0.985 u[IU]/mL (ref 0.350–4.500)

## 2021-10-23 NOTE — Addendum Note (Signed)
Addended by: Joylene John D on: 10/23/2021 12:18 PM   Modules accepted: Orders

## 2021-10-25 LAB — URINE CULTURE: Culture: 30000 — AB

## 2021-10-26 ENCOUNTER — Other Ambulatory Visit: Payer: Self-pay | Admitting: Gynecologic Oncology

## 2021-10-26 ENCOUNTER — Telehealth: Payer: Self-pay | Admitting: *Deleted

## 2021-10-26 DIAGNOSIS — A499 Bacterial infection, unspecified: Secondary | ICD-10-CM

## 2021-10-26 DIAGNOSIS — R300 Dysuria: Secondary | ICD-10-CM

## 2021-10-26 MED ORDER — NITROFURANTOIN MONOHYD MACRO 100 MG PO CAPS: 100.0000 mg | ORAL_CAPSULE | Freq: Two times a day (BID) | ORAL | 0 refills | Status: DC

## 2021-10-26 NOTE — Progress Notes (Signed)
See RN note. Plan to treat symptomatic urination. Culture returning with 30, 000 colonies of E coli.

## 2021-10-26 NOTE — Telephone Encounter (Signed)
With the assistance of spanish interpreter Felicita Gage [phone] attempted to follow up with pt in regards to her urine culture and to assess for any signs of infection. Unable to reach pt. LVM for return call.

## 2021-10-26 NOTE — Telephone Encounter (Signed)
Spoke with Shirley Pearson to let her know that an antibiotic prescription has been sent in to her requested pharmacy and it'll cost her $20.17. She verbalized understanding.

## 2021-10-26 NOTE — Telephone Encounter (Signed)
Spoke with pt this morning and reviewed her labs with her. Per Dr.Tucker The rest of your labs are back.  Your thyroid function is normal.  Your blood sugar levels are in the range consistent with a diagnosis of diabetes.  It is really important that you get established with a primary care provider.  Please call the office if there is anything that we can do to help. Informed pt to call and get an appointment with a PCP. Also informed her that her urine culture shows a growth of 30,000. Pt stated she's having a little bit of pain and irration when she urinates. She endorses itchiness in the vaginal area as well as intermittent lower abdominal pain that comes and goes. When it is present it's a pain of a 5/10 and she takes tylenol for it. It stated about 1 week ago. She denies odor, discharge, fever, or chills. Informed pt we would let the provider know. They may prescribe some antibiotics for pt. Pt prefers the [facility] on [address]. Pt verbalized understanding.

## 2021-11-19 ENCOUNTER — Ambulatory Visit (INDEPENDENT_AMBULATORY_CARE_PROVIDER_SITE_OTHER): Payer: Self-pay | Admitting: Family Medicine

## 2021-11-19 ENCOUNTER — Encounter: Payer: Self-pay | Admitting: Family Medicine

## 2021-11-19 VITALS — BP 182/109 | HR 92 | Ht 60.0 in | Wt 164.0 lb

## 2021-11-19 DIAGNOSIS — Z114 Encounter for screening for human immunodeficiency virus [HIV]: Secondary | ICD-10-CM

## 2021-11-19 DIAGNOSIS — I10 Essential (primary) hypertension: Secondary | ICD-10-CM

## 2021-11-19 DIAGNOSIS — Z1159 Encounter for screening for other viral diseases: Secondary | ICD-10-CM

## 2021-11-19 DIAGNOSIS — E119 Type 2 diabetes mellitus without complications: Secondary | ICD-10-CM

## 2021-11-19 DIAGNOSIS — Z7689 Persons encountering health services in other specified circumstances: Secondary | ICD-10-CM

## 2021-11-19 MED ORDER — VALSARTAN 80 MG PO TABS: 80.0000 mg | ORAL_TABLET | Freq: Every day | ORAL | 0 refills | Status: DC

## 2021-11-19 NOTE — Patient Instructions (Signed)
It was great to meet you!  Things we discussed at today's visit: - Your blood pressure was high. We are starting a medication called Valsartan. Take one pill once daily.  -Check your blood pressure once daily at home. Keep a written log of the numbers and bring it to your next appointment.  -You have diabetes. Fortunately we caught it pretty early and I am confident you can get rid of it if you change some of your dietary habits. -Switch from regular soda to diet soda. REDUCE soda intake with an eventual goal of 1 soda or less per week. -At your next visit we can discuss referral to a nutritionist for more suggestions on dietary changes to address your diabetes  -I have ordered several labs. Please call the office at [phone] to schedule a lab appointment.  See me back in 3 weeks for follow up.  Take care and seek immediate care sooner if you develop any concerns.  Dr. [facility]

## 2021-11-19 NOTE — Progress Notes (Signed)
   Subjective:    Patient ID: Shirley Pearson, female    DOB: 05/24/1980, 41 y.o.   MRN: [id number]  CC: Establish care  HPI:  Shirley Pearson is a very pleasant 41 y.o. female who presents today to establish care.  Initial concerns:  -blood pressure -wants to check for diabetes, oncologist said her sugar was elevated, there is a family history  Past medical history: -endometrial cancer  Current medications: Macrobid (recently diagnosed w/UTI), Tylenol prn  Past surgical history: s/p total hysterectomy (2021)  Family history:  Diabetes in Mom and maternal aunts Dad died via suicide when she was a child No family hx of HTN, MI or CVA. 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 normal heart sounds, no murmurs Respiratory: CTAB, normal effort, No wheezes, rales or rhonchi Abdomen: soft, non-tender, non-distended Extremities: no edema or cyanosis Skin: warm and dry, no rashes noted Neuro: alert, no obvious focal deficits Psych: Normal affect and mood   Assessment & Plan:   Hypertension New diagnosis. BP significantly elevated today. On chart review, BP has been elevated at multiple gyn-onc visits for at least 1 year. -Start Valsartan '80mg'$  daily -Advised to check BP at home -Obtain BMP (will return for lab visit due to EMR related issues today) -Follow up in 3 weeks. Plan for repeat BMP at that time  Type 2 diabetes mellitus  without complication, without long-term current use of insulin ([place]) New diagnosis. A1c elevated to 6.6% on 10/22/21.  -Check BMP, lipid panel (return for lab visit) -Encouraged dietary modifications, mainly reducing soda intake -Next A1c Sept 2023. If persistently elevated, will plan to initiate medication -Started on ARB as above -Will need urine microalbumin and foot/eye exams- to be done/discussed at next visit -Recommend nutrition referral at next visit  Encounter to Establish Care; Health Maintenance -Screening for HIV, Hep C ordered   Alcus Dad, MD [place] PGY-2

## 2021-11-20 ENCOUNTER — Encounter: Payer: Self-pay | Admitting: Family Medicine

## 2021-11-20 ENCOUNTER — Other Ambulatory Visit: Payer: Self-pay

## 2021-11-20 DIAGNOSIS — Z114 Encounter for screening for human immunodeficiency virus [HIV]: Secondary | ICD-10-CM

## 2021-11-20 DIAGNOSIS — E119 Type 2 diabetes mellitus without complications: Secondary | ICD-10-CM

## 2021-11-20 DIAGNOSIS — Z1159 Encounter for screening for other viral diseases: Secondary | ICD-10-CM

## 2021-11-20 DIAGNOSIS — I10 Essential (primary) hypertension: Secondary | ICD-10-CM | POA: Insufficient documentation

## 2021-11-20 NOTE — Assessment & Plan Note (Addendum)
New diagnosis. A1c elevated to 6.6% on 10/22/21.  -Check BMP, lipid panel (return for lab visit) -Encouraged dietary modifications, mainly reducing soda intake -Next A1c Sept 2023. If persistently elevated, will plan to initiate medication -Started on ARB as above -Will need urine microalbumin and foot/eye exams- to be done/discussed at next visit -Recommend nutrition referral at next visit

## 2021-11-20 NOTE — Assessment & Plan Note (Signed)
New diagnosis. BP significantly elevated today. On chart review, BP has been elevated at multiple gyn-onc visits for at least 1 year. -Start Valsartan '80mg'$  daily -Advised to check BP at home -Obtain BMP (will return for lab visit due to EMR related issues today) -Follow up in 3 weeks. Plan for repeat BMP at that time

## 2021-11-21 LAB — BASIC METABOLIC PANEL
BUN/Creatinine Ratio: 22 (ref 9–23)
BUN: 12 mg/dL (ref 6–20)
CO2: 24 mmol/L (ref 20–29)
Calcium: 9.2 mg/dL (ref 8.7–10.2)
Chloride: 103 mmol/L (ref 96–106)
Creatinine, Ser: 0.55 mg/dL — ABNORMAL LOW (ref 0.57–1.00)
Glucose: 125 mg/dL — ABNORMAL HIGH (ref 70–99)
Potassium: 4.8 mmol/L (ref 3.5–5.2)
Sodium: 140 mmol/L (ref 134–144)
eGFR: 120 mL/min/{1.73_m2} (ref >59)

## 2021-12-15 ENCOUNTER — Ambulatory Visit (INDEPENDENT_AMBULATORY_CARE_PROVIDER_SITE_OTHER): Payer: Self-pay | Admitting: Family Medicine

## 2021-12-15 ENCOUNTER — Encounter: Payer: Self-pay | Admitting: Family Medicine

## 2021-12-15 VITALS — BP 144/91 | HR 80 | Ht 60.0 in | Wt 162.6 lb

## 2021-12-15 DIAGNOSIS — E119 Type 2 diabetes mellitus without complications: Secondary | ICD-10-CM

## 2021-12-15 DIAGNOSIS — I10 Essential (primary) hypertension: Secondary | ICD-10-CM

## 2021-12-15 MED ORDER — LOSARTAN POTASSIUM-HCTZ 50-12.5 MG PO TABS: 1.0000 | ORAL_TABLET | Freq: Every day | ORAL | 0 refills | Status: AC

## 2021-12-15 NOTE — Assessment & Plan Note (Addendum)
BP improved from prior but remains above goal. Due to financial barriers (self-pay) will change from Valsartan '80mg'$  daily to Losartan-HCTZ 50-12.'5mg'$  daily (on discount drug list, should be $10 for 90 day supply). Check BMP today. Continue home BP monitoring.

## 2021-12-15 NOTE — Assessment & Plan Note (Addendum)
Recently diagnosed due to A1c 6.6%. Currently diet controlled. -Offered nutrition referral, patient declines at this time -Foot exam performed today -Encouraged to have diabetic eye exam -Recent BMP unremarkable, checking repeat today -on ARB -Due to financial barriers, will defer obtaining lipid panel and urine microalbumin for now

## 2021-12-15 NOTE — Patient Instructions (Addendum)
It was great to see you!  Your blood pressure is still higher than we'd like. I will adjust your medication tomorrow after your lab results are back. I will send you a MyChart message with the details.  Continue checking your blood pressure a few times per week at home. Bring the log to your next appointment.  Great job cutting out soda! Continue working on healthy dietary choices (low glycemic index foods) for your diabetes. If you decide you want to see a nutritionist, let me know.  You need a diabetic eye exam. Please contact your eye doctor to schedule an appointment. Ask them to fax the notes to our office.  Take care, Dr Rock Nephew

## 2021-12-15 NOTE — Progress Notes (Signed)
    SUBJECTIVE:   CHIEF COMPLAINT / HPI:   Hypertension: Patient is a 41 y.o. female who presents today for HTN follow-up. Recently diagnosed at her last visit on 11/19/2021.  Home medications include: valsartan '80mg'$  daily Patient reports excellent medication compliance.  Patient does check blood pressure at home. Averages in the 140s/90s. See media tab for log of home readings.  Patient has had a BMP in the past 1 year.  Type 2 Diabetes Newly diagnosed with A1c 6.6% on 10/22/21. Working on dietary modifications. Not on medications at this time. Does not check sugar at home. Since her last visit she has cut out soda entirely which is a big improvement!  PERTINENT  PMH / PSH: endometrial cancer s/p total hysterectomy  OBJECTIVE:   BP (!) 144/91   Pulse 80   Ht 5' (1.524 m)   Wt 162 lb 9.6 oz (73.8 kg)   LMP 11/05/2019 Comment: urine preg.negative-11/13/19  SpO2 100%   BMI 31.76 kg/m   Gen: NAD, pleasant, able to participate in exam CV: RRR, normal S1/S2, no murmur Resp: Normal effort, lungs CTAB Extremities: no edema or cyanosis Skin: warm and dry, no rashes noted Neuro: alert, no obvious focal deficits Psych: Normal affect and mood Diabetic foot exam was performed with the following findings:   No deformities, ulcerations, or other skin breakdown Normal sensation of 10g monofilament Intact posterior tibialis and dorsalis pedis pulses     ASSESSMENT/PLAN:   Hypertension BP improved from prior but remains above goal. Due to financial barriers (self-pay) will change from Valsartan '80mg'$  daily to Losartan-HCTZ 50-12.'5mg'$  daily (on discount drug list, should be $10 for 90 day supply). Check BMP today. Continue home BP monitoring.  Type 2 diabetes mellitus without complication, without long-term current use of insulin ([place]) Recently diagnosed due to A1c 6.6%. Currently diet controlled. -Offered nutrition referral, patient declines at this time -Foot exam performed  today -Encouraged to have diabetic eye exam -Recent BMP unremarkable, checking repeat today -on ARB -Due to financial barriers, will defer obtaining lipid panel and urine microalbumin for now   Alcus Dad, MD [place]

## 2021-12-16 LAB — BASIC METABOLIC PANEL
BUN/Creatinine Ratio: 18 (ref 9–23)
BUN: 9 mg/dL (ref 6–20)
CO2: 23 mmol/L (ref 20–29)
Calcium: 9.2 mg/dL (ref 8.7–10.2)
Chloride: 98 mmol/L (ref 96–106)
Creatinine, Ser: 0.51 mg/dL — ABNORMAL LOW (ref 0.57–1.00)
Glucose: 125 mg/dL — ABNORMAL HIGH (ref 70–99)
Potassium: 4.3 mmol/L (ref 3.5–5.2)
Sodium: 135 mmol/L (ref 134–144)
eGFR: 122 mL/min/{1.73_m2} (ref >59)

## 2022-02-26 ENCOUNTER — Telehealth: Payer: Self-pay | Admitting: *Deleted

## 2022-02-26 NOTE — Telephone Encounter (Signed)
Patient called and scheduled a follow up appt with Dr Berline Lopes on 12/15

## 2022-04-29 ENCOUNTER — Encounter: Payer: Self-pay | Admitting: Gynecologic Oncology

## 2022-04-30 ENCOUNTER — Inpatient Hospital Stay: Payer: Self-pay | Attending: Gynecologic Oncology | Admitting: Gynecologic Oncology

## 2022-04-30 ENCOUNTER — Encounter: Payer: Self-pay | Admitting: Gynecologic Oncology

## 2022-04-30 VITALS — BP 173/92 | HR 71 | Resp 18 | Ht 60.0 in | Wt 162.4 lb

## 2022-04-30 DIAGNOSIS — C541 Malignant neoplasm of endometrium: Secondary | ICD-10-CM

## 2022-04-30 DIAGNOSIS — Z90722 Acquired absence of ovaries, bilateral: Secondary | ICD-10-CM | POA: Insufficient documentation

## 2022-04-30 DIAGNOSIS — Z90710 Acquired absence of both cervix and uterus: Secondary | ICD-10-CM | POA: Insufficient documentation

## 2022-04-30 DIAGNOSIS — Z8542 Personal history of malignant neoplasm of other parts of uterus: Secondary | ICD-10-CM | POA: Insufficient documentation

## 2022-04-30 NOTE — Progress Notes (Signed)
Gynecologic Oncology Return Clinic Visit  04/30/22  Reason for Visit: Surveillance visit in the setting of early stage uterine cancer   Treatment History: Oncology History Overview Note  IHC MMR intact   Endometrial cancer ([place])  10/08/2019 Initial Biopsy   EMB - FIGO gr1 endometrioid adenoca  MMR IHC intact   10/10/2019 Initial Diagnosis   Endometrial cancer ([place])   10/22/2019 Surgery   D&C and Mirena IUD insertion  Path - FIGO gr 1   10/31/2019 Imaging   MRI pelvis: Uterus: Measures 14.8 x 8 5 by 12.2 cm (volume = 300 cm^3). A focal adenomyoma is seen in the left posterior uterine corpus measuring 4.7 cm. A large ill-defined heterogeneously enhancing endometrial mass is seen in the uterine body and corpus, consistent with known endometrial carcinoma. This shows deep myometrial invasion of greater than 50% in the right uterine fundus and corpus. No evidence extra uterine extension tumor. An IUD is visualized in inferior portion of the endometrial cavity in the lower uterine segment. Cervix and vagina are unremarkable.   11/13/2019 Surgery   Robotic-assisted laparoscopic total hysterectomy with bilateral salpingectomy, SLN biopsy, mini-lap for specimen removal    11/13/2019 Pathology Results   IA, grade 1 EMCA, <20% MI, no LVSI, neg SLNs     Interval History: The patient reports doing well.  She denies any vaginal bleeding or discharge.  She reports normal bowel and bladder function.  Denies any abdominal or pelvic pain.  She continues to have some bilateral hip pain, does not bother her all the time, has been there since before surgery.  Past Medical/Surgical History: Past Medical History:  Diagnosis Date   Abnormal uterine bleeding    Anemia    Complex atypical endometrial hyperplasia    Obesity (BMI 30-39.9)    Uterine cancer ([place])    Stage 1A, grade 1 low risk    Past Surgical History:  Procedure Laterality Date   DILATION AND CURETTAGE OF UTERUS  yrs ago    DILATION AND CURETTAGE OF UTERUS N/A 10/22/2019   Procedure: DILATATION AND CURETTAGE;  Surgeon: Lafonda Mosses, MD;  Location: [facility];  Service: Gynecology;  Laterality: N/A;   INTRAUTERINE DEVICE (IUD) INSERTION N/A 10/22/2019   Procedure: INTRAUTERINE DEVICE (IUD) INSERTION;  Surgeon: Lafonda Mosses, MD;  Location: [facility];  Service: Gynecology;  Laterality: N/A;   OPERATIVE ULTRASOUND N/A 10/22/2019   Procedure: OPERATIVE ULTRASOUND;  Surgeon: Lafonda Mosses, MD;  Location: [facility];  Service: Gynecology;  Laterality: N/A;   ROBOTIC ASSISTED TOTAL HYSTERECTOMY N/A 11/13/2019   Procedure: XI ROBOTIC ASSISTED HYSTERECTOMY, SALPINGECTOMY, SENTINEL NODE BIOPSY LYMPH NODE DISSECTION, MINI LAPAROTOMY;  Surgeon: Lafonda Mosses, MD;  Location: [facility];  Service: Gynecology;  Laterality: N/A;    Family History  Problem Relation Age of Onset   Diabetes Mother    Colon cancer Neg Hx    Breast cancer Neg Hx    Ovarian cancer Neg Hx    Uterine cancer Neg Hx     Social History   Socioeconomic History   Marital status: Single    Spouse name: Not on file   Number of children: Not on file   Years of education: Not on file   Highest education level: Not on file  Occupational History   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  Tobacco Use   Smoking status: Never   Smokeless tobacco: Never  Vaping Use   Vaping Use: Never used  Substance  and Sexual Activity   Alcohol use: Yes    Comment: occas   Drug use: Never   Sexual activity: Yes  Other Topics Concern   Not on file  Social History Narrative   Not on file   Social Determinants of Health   Financial Resource Strain: Not on file  Food Insecurity: Not on file  Transportation Needs: Not on file  Physical Activity: Not on file  Stress: Not on file  Social Connections: Not on file    Current Medications:  Current Outpatient Medications:     losartan-hydrochlorothiazide (HYZAAR) 50-12.5 MG tablet, Take 1 tablet by mouth daily., Disp: 90 tablet, Rfl: 0  Review of Systems: Denies appetite changes, fevers, chills, fatigue, unexplained weight changes. Denies hearing loss, neck lumps or masses, mouth sores, ringing in ears or voice changes. Denies cough or wheezing.  Denies shortness of breath. Denies chest pain or palpitations. Denies leg swelling. Denies abdominal distention, pain, blood in stools, constipation, diarrhea, nausea, vomiting, or early satiety. Denies pain with intercourse, dysuria, frequency, hematuria or incontinence. Denies hot flashes, pelvic pain, vaginal bleeding or vaginal discharge.   Denies joint pain, back pain or muscle pain/cramps. Denies itching, rash, or wounds. Denies dizziness, headaches, numbness or seizures. Denies swollen lymph nodes or glands, denies easy bruising or bleeding. Denies anxiety, depression, confusion, or decreased concentration.  Physical Exam: BP (!) 173/92 (BP Location: Left Arm, Patient Position: Sitting)   Pulse 71   Resp 18   Ht 5' (1.524 m)   Wt 162 lb 6 oz (73.7 kg)   LMP 11/05/2019 Comment: urine preg.negative-11/13/19  SpO2 100%   BMI 31.71 kg/m  General: Alert, oriented, no acute distress. HEENT: Normocephalic, atraumatic, sclera anicteric. Chest: Unlabored breathing on room air. Cardiovascular: Regular rate and rhythm, no murmurs. Abdomen: Obese, soft, nontender.  Normoactive bowel sounds.  No masses or hepatosplenomegaly appreciated.  Well-healed incisions. Extremities: Grossly normal range of motion.  Warm, well perfused.  No edema bilaterally. Skin: No rashes or lesions noted. Lymphatics: No cervical, supraclavicular, or inguinal adenopathy. GU: Normal appearing external genitalia without erythema, excoriation, or lesions.  Speculum exam reveals well rugated vaginal mucosa, no lesions or masses.  No bleeding or discharge.  Bimanual exam reveals cuff intact, no  nodularity or masses.  Rectovaginal exam confirms these findings.  Laboratory & Radiologic Studies: None new  Assessment & Plan: Shirley Pearson is a 41 y.o. 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 Surgery 10/2019. MMR intact. Ovaries left in situ.   The patient continues to do well and is NED on exam today.   She has been able to establish with a primary care provider since her last visit with me.   In the setting of low risk early stage endometrial cancer, we discussed NCCN and SGO surveillance recommendations of follow-up visit every 6 months for the first year.  We will continue with visits every 6 months at this time.  She continues to do well at her next visit, we will transition to visits alternating between my office and her OB/GYN.  I have asked her to schedule an appointment with Dr. Billee Cashing Comb for the summer and to call my office back to schedule a visit at the end of next year.  I reviewed the signs and symptoms that would be concerning for disease recurrence and that should prompt a phone call prior to her next scheduled visit.  Routine Pap smears are not indicated.  20 minutes  of total time was spent for this patient encounter, including preparation, face-to-face counseling with the patient and coordination of care, and documentation of the encounter.  Jeral Pinch, MD  Division of Gynecologic Oncology  Department of Obstetrics and Gynecology  [facility]

## 2022-04-30 NOTE — Patient Instructions (Signed)
It was good to see you today.  I do not see or feel any evidence of cancer recurrence on your exam.  I will see you for follow-up in 12 months.  Please call your OB/GYN to schedule a follow-up visit in 6 months.  Please call my office sometime after the summer to schedule a visit to see me in December of next year.  As always, if you develop any new and concerning symptoms before your next visit, please call to see me sooner.

## 2023-01-04 ENCOUNTER — Encounter: Payer: Self-pay | Admitting: Obstetrics and Gynecology
